# Patient Record
Sex: Male | Born: 1935 | Race: White | Hispanic: No | Smoking: Former smoker
Health system: Southern US, Community
[De-identification: ages and names within clinical notes are randomized; demographics above are authoritative.]

## PROBLEM LIST (undated history)

## (undated) DIAGNOSIS — I499 Cardiac arrhythmia, unspecified: Secondary | ICD-10-CM

## (undated) DIAGNOSIS — I219 Acute myocardial infarction, unspecified: Secondary | ICD-10-CM

---
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
## 2017-04-11 DIAGNOSIS — I219 Acute myocardial infarction, unspecified: Secondary | ICD-10-CM

## 2017-04-11 HISTORY — PX: CORONARY ANGIOPLASTY: SHX604

## 2017-04-11 HISTORY — DX: Acute myocardial infarction, unspecified: I21.9

## 2018-11-09 ENCOUNTER — Ambulatory Visit (INDEPENDENT_AMBULATORY_CARE_PROVIDER_SITE_OTHER): Payer: Medicare Other | Admitting: Urology

## 2018-11-09 DIAGNOSIS — N471 Phimosis: Secondary | ICD-10-CM

## 2018-12-27 ENCOUNTER — Other Ambulatory Visit: Payer: Self-pay | Admitting: Urology

## 2019-01-07 NOTE — Patient Instructions (Signed)
Christian Lloyd  01/07/2019     @PREFPERIOPPHARMACY @   Your procedure is scheduled on  01/13/2019  Report to [facility] at  [email or web address]M.  Call this number if you have problems the morning of surgery:  [phone]   Remember:  Do not eat or drink after midnight.                        Take these medicines the morning of surgery with A SIP OF WATER  Metoprolol.    Do not wear jewelry, make-up or nail polish.  Do not wear lotions, powders, or perfumes. Please wear deodorant and brush your teeth.  Do not shave 48 hours prior to surgery.  Men may shave face and neck.  Do not bring valuables to the hospital.  [facility] is not responsible for any belongings or valuables.  Contacts, dentures or bridgework may not be worn into surgery.  Leave your suitcase in the car.  After surgery it may be brought to your room.  For patients admitted to the hospital, discharge time will be determined by your treatment team.  Patients discharged the day of surgery will not be allowed to drive home.   Name and phone number of your driver:   family Special instructions:  FOLLOW ANY INSTRUCTIONS GIVEN TO YOU BY DR Diona Fanti CONCERNING HOLDING YOUR PLAVIX.  Please read over the following fact sheets that you were given. Anesthesia Post-op Instructions and Care and Recovery After Surgery       Circumcision, Adult, Care After This sheet gives you information about how to care for yourself after your procedure. Your doctor may also give you more specific instructions. If you have problems or questions, contact your doctor. Follow these instructions at home: Cut care   Follow instructions from your doctor about how to take care of your cut from surgery (incision). Make sure you: ? Wash your hands with soap and water before you change your bandage (dressing). If you cannot use soap and water, use hand sanitizer. ? Change your bandage as told by your doctor. ? Leave stitches (sutures), skin  glue, or skin tape (adhesive) strips in place. They may need to stay in place for 2 weeks or longer. If tape strips get loose and curl up, you may trim the loose edges. Do not remove tape strips completely unless your doctor says it is okay  Check your cut area every day for signs of infection. Check for: ? More redness, swelling, or pain. ? More fluid or blood. ? Warmth. ? Pus or a bad smell. Bathing  Do not take baths, swim, or use a hot tub until your doctor says it is okay. Ask your doctor if you can take showers. You may only be allowed to take sponge baths for bathing.  Do not get your cut area wet during the 24 hours after the procedure, or as long as told by your doctor.  When you can shower, do not rub the cut area. Gently pat it dry. General instructions  Avoid heavy lifting, contact sports, biking, or swimming until your doctor says it is okay.  Do not have sex until your doctor says it is okay.  Take or apply over-the-counter and prescription medicines only as told by your doctor.  Keep all follow-up visits as told by your doctor. This is important. Contact a doctor if:  Medicine does not help your pain.  You have  more redness, swelling, or pain around your cut.  You have more fluid or blood coming from your cut.  Your cut feels warm to the touch.  You have pus or a bad smell coming from your cut.  You have a fever. Get help right away if:  You cannot pee (urinate).  It hurts to pee.  Your pain is not helped by medicines.  There is redness, swelling, and soreness that spreads up the shaft of your penis, your thighs, or your lower belly (abdomen).  You have bleeding that does not stop when you press on it. Summary  Follow instructions from your doctor about how to take care of your cut from surgery (incision).  Check your cut area every day for signs of infection.  Do not have sex until your doctor says it is okay. This information is not intended to  replace advice given to you by your health care provider. Make sure you discuss any questions you have with your health care provider. Document Released: 10/15/2007 Document Revised: 04/10/2017 Document Reviewed: 05/06/2016 Elsevier Patient Education  2020 [employer]vier Inc.  General Anesthesia, Adult, Care After This sheet gives you information about how to care for yourself after your procedure. Your health care provider may also give you more specific instructions. If you have problems or questions, contact your health care provider. What can I expect after the procedure? After the procedure, the following side effects are common:  Pain or discomfort at the IV site.  Nausea.  Vomiting.  Sore throat.  Trouble concentrating.  Feeling cold or chills.  Weak or tired.  Sleepiness and fatigue.  Soreness and body aches. These side effects can affect parts of the body that were not involved in surgery. Follow these instructions at home:  For at least 24 hours after the procedure:  Have a responsible adult stay with you. It is important to have someone help care for you until you are awake and alert.  Rest as needed.  Do not: ? Participate in activities in which you could fall or become injured. ? Drive. ? Use heavy machinery. ? Drink alcohol. ? Take sleeping pills or medicines that cause drowsiness. ? Make important decisions or sign legal documents. ? Take care of children on your own. Eating and drinking  Follow any instructions from your health care provider about eating or drinking restrictions.  When you feel hungry, start by eating small amounts of foods that are soft and easy to digest (bland), such as toast. Gradually return to your regular diet.  Drink enough fluid to keep your urine pale yellow.  If you vomit, rehydrate by drinking water, juice, or clear broth. General instructions  If you have sleep apnea, surgery and certain medicines can increase your risk for  breathing problems. Follow instructions from your health care provider about wearing your sleep device: ? Anytime you are sleeping, including during daytime naps. ? While taking prescription pain medicines, sleeping medicines, or medicines that make you drowsy.  Return to your normal activities as told by your health care provider. Ask your health care provider what activities are safe for you.  Take over-the-counter and prescription medicines only as told by your health care provider.  If you smoke, do not smoke without supervision.  Keep all follow-up visits as told by your health care provider. This is important. Contact a health care provider if:  You have nausea or vomiting that does not get better with medicine.  You cannot eat or drink without  vomiting.  You have pain that does not get better with medicine.  You are unable to pass urine.  You develop a skin rash.  You have a fever.  You have redness around your IV site that gets worse. Get help right away if:  You have difficulty breathing.  You have chest pain.  You have blood in your urine or stool, or you vomit blood. Summary  After the procedure, it is common to have a sore throat or nausea. It is also common to feel tired.  Have a responsible adult stay with you for the first 24 hours after general anesthesia. It is important to have someone help care for you until you are awake and alert.  When you feel hungry, start by eating small amounts of foods that are soft and easy to digest (bland), such as toast. Gradually return to your regular diet.  Drink enough fluid to keep your urine pale yellow.  Return to your normal activities as told by your health care provider. Ask your health care provider what activities are safe for you. This information is not intended to replace advice given to you by your health care provider. Make sure you discuss any questions you have with your health care provider. Document  Released: 08/04/2000 Document Revised: 05/01/2017 Document Reviewed: 12/12/2016 Elsevier Patient Education  2020 [employer] How to Use Chlorhexidine for Bathing Chlorhexidine gluconate (CHG) is a germ-killing (antiseptic) solution that is used to clean the skin. It can get rid of the bacteria that normally live on the skin and can keep them away for about 24 hours. To clean your skin with CHG, you may be given:  A CHG solution to use in the shower or as part of a sponge bath.  A prepackaged cloth that contains CHG. Cleaning your skin with CHG may help lower the risk for infection:  While you are staying in the intensive care unit of the hospital.  If you have a vascular access, such as a central line, to provide short-term or long-term access to your veins.  If you have a catheter to drain urine from your bladder.  If you are on a ventilator. A ventilator is a machine that helps you breathe by moving air in and out of your lungs.  After surgery. What are the risks? Risks of using CHG include:  A skin reaction.  Hearing loss, if CHG gets in your ears.  Eye injury, if CHG gets in your eyes and is not rinsed out.  The CHG product catching fire. Make sure that you avoid smoking and flames after applying CHG to your skin. Do not use CHG:  If you have a chlorhexidine allergy or have previously reacted to chlorhexidine.  On babies younger than 232 months of age. How to use CHG solution  Use CHG only as told by your health care provider, and follow the instructions on the label.  Use the full amount of CHG as directed. Usually, this is one bottle. During a shower Follow these steps when using CHG solution during a shower (unless your health care provider gives you different instructions): 1. Start the shower. 2. Use your normal soap and shampoo to wash your face and hair. 3. Turn off the shower or move out of the shower stream. 4. Pour the CHG onto a clean washcloth. Do not use  any type of brush or rough-edged sponge. 5. Starting at your neck, lather your body down to your toes. Make sure you follow these  instructions: ? If you will be having surgery, pay special attention to the part of your body where you will be having surgery. Scrub this area for at least 1 minute. ? Do not use CHG on your head or face. If the solution gets into your ears or eyes, rinse them well with water. ? Avoid your genital area. ? Avoid any areas of skin that have broken skin, cuts, or scrapes. ? Scrub your back and under your arms. Make sure to wash skin folds. 6. Let the lather sit on your skin for 1-2 minutes or as long as told by your health care provider. 7. Thoroughly rinse your entire body in the shower. Make sure that all body creases and crevices are rinsed well. 8. Dry off with a clean towel. Do not put any substances on your body afterward--such as powder, lotion, or perfume--unless you are told to do so by your health care provider. Only use lotions that are recommended by the manufacturer. 9. Put on clean clothes or pajamas. 10. If it is the night before your surgery, sleep in clean sheets.  During a sponge bath Follow these steps when using CHG solution during a sponge bath (unless your health care provider gives you different instructions): 1. Use your normal soap and shampoo to wash your face and hair. 2. Pour the CHG onto a clean washcloth. 3. Starting at your neck, lather your body down to your toes. Make sure you follow these instructions: ? If you will be having surgery, pay special attention to the part of your body where you will be having surgery. Scrub this area for at least 1 minute. ? Do not use CHG on your head or face. If the solution gets into your ears or eyes, rinse them well with water. ? Avoid your genital area. ? Avoid any areas of skin that have broken skin, cuts, or scrapes. ? Scrub your back and under your arms. Make sure to wash skin folds. 4. Let the  lather sit on your skin for 1-2 minutes or as long as told by your health care provider. 5. Using a different clean, wet washcloth, thoroughly rinse your entire body. Make sure that all body creases and crevices are rinsed well. 6. Dry off with a clean towel. Do not put any substances on your body afterward--such as powder, lotion, or perfume--unless you are told to do so by your health care provider. Only use lotions that are recommended by the manufacturer. 7. Put on clean clothes or pajamas. 8. If it is the night before your surgery, sleep in clean sheets. How to use CHG prepackaged cloths  Only use CHG cloths as told by your health care provider, and follow the instructions on the label.  Use the CHG cloth on clean, dry skin.  Do not use the CHG cloth on your head or face unless your health care provider tells you to.  When washing with the CHG cloth: ? Avoid your genital area. ? Avoid any areas of skin that have broken skin, cuts, or scrapes. Before surgery Follow these steps when using a CHG cloth to clean before surgery (unless your health care provider gives you different instructions): 1. Using the CHG cloth, vigorously scrub the part of your body where you will be having surgery. Scrub using a back-and-forth motion for 3 minutes. The area on your body should be completely wet with CHG when you are done scrubbing. 2. Do not rinse. Discard the cloth and let the area  air-dry. Do not put any substances on the area afterward, such as powder, lotion, or perfume. 3. Put on clean clothes or pajamas. 4. If it is the night before your surgery, sleep in clean sheets.  For general bathing Follow these steps when using CHG cloths for general bathing (unless your health care provider gives you different instructions). 1. Use a separate CHG cloth for each area of your body. Make sure you wash between any folds of skin and between your fingers and toes. Wash your body in the following order,  switching to a new cloth after each step: ? The front of your neck, shoulders, and chest. ? Both of your arms, under your arms, and your hands. ? Your stomach and groin area, avoiding the genitals. ? Your right leg and foot. ? Your left leg and foot. ? The back of your neck, your back, and your buttocks. 2. Do not rinse. Discard the cloth and let the area air-dry. Do not put any substances on your body afterward--such as powder, lotion, or perfume--unless you are told to do so by your health care provider. Only use lotions that are recommended by the manufacturer. 3. Put on clean clothes or pajamas. Contact a health care provider if:  Your skin gets irritated after scrubbing.  You have questions about using your solution or cloth. Get help right away if:  Your eyes become very red or swollen.  Your eyes itch badly.  Your skin itches badly and is red or swollen.  Your hearing changes.  You have trouble seeing.  You have swelling or tingling in your mouth or throat.  You have trouble breathing.  You swallow any chlorhexidine. Summary  Chlorhexidine gluconate (CHG) is a germ-killing (antiseptic) solution that is used to clean the skin. Cleaning your skin with CHG may help to lower your risk for infection.  You may be given CHG to use for bathing. It may be in a bottle or in a prepackaged cloth to use on your skin. Carefully follow your health care provider's instructions and the instructions on the product label.  Do not use CHG if you have a chlorhexidine allergy.  Contact your health care provider if your skin gets irritated after scrubbing. This information is not intended to replace advice given to you by your health care provider. Make sure you discuss any questions you have with your health care provider. Document Released: 01/21/2012 Document Revised: 07/15/2018 Document Reviewed: 03/26/2017 Elsevier Patient Education  2020 [employer]

## 2019-01-11 ENCOUNTER — Other Ambulatory Visit: Payer: Self-pay

## 2019-01-11 ENCOUNTER — Encounter (HOSPITAL_COMMUNITY)
Admission: RE | Admit: 2019-01-11 | Discharge: 2019-01-11 | Disposition: A | Discharge status: Home / Self Care | Payer: Medicare Other | Source: Ambulatory Visit | Attending: Urology | Admitting: Urology

## 2019-01-11 ENCOUNTER — Encounter (HOSPITAL_COMMUNITY): Payer: Self-pay

## 2019-01-11 ENCOUNTER — Other Ambulatory Visit (HOSPITAL_COMMUNITY)
Admission: RE | Admit: 2019-01-11 | Discharge: 2019-01-11 | Disposition: A | Discharge status: Home / Self Care | Payer: Medicare Other | Source: Ambulatory Visit | Attending: Urology | Admitting: Urology

## 2019-01-11 DIAGNOSIS — N480 Leukoplakia of penis: Secondary | ICD-10-CM | POA: Diagnosis not present

## 2019-01-11 DIAGNOSIS — I252 Old myocardial infarction: Secondary | ICD-10-CM | POA: Diagnosis not present

## 2019-01-11 DIAGNOSIS — Z87891 Personal history of nicotine dependence: Secondary | ICD-10-CM | POA: Diagnosis not present

## 2019-01-11 DIAGNOSIS — Z955 Presence of coronary angioplasty implant and graft: Secondary | ICD-10-CM | POA: Diagnosis not present

## 2019-01-11 DIAGNOSIS — N471 Phimosis: Secondary | ICD-10-CM | POA: Diagnosis not present

## 2019-01-11 HISTORY — DX: Cardiac arrhythmia, unspecified: I49.9

## 2019-01-11 LAB — BASIC METABOLIC PANEL
Anion gap: 10 (ref 5–15)
BUN: 10 mg/dL (ref 8–23)
CO2: 23 mmol/L (ref 22–32)
Calcium: 8.7 mg/dL — ABNORMAL LOW (ref 8.9–10.3)
Chloride: 106 mmol/L (ref 98–111)
Creatinine, Ser: 0.82 mg/dL (ref 0.61–1.24)
GFR calc Af Amer: >60 mL/min (ref >60)
GFR calc non Af Amer: >60 mL/min (ref >60)
Glucose, Bld: 188 mg/dL — ABNORMAL HIGH (ref 70–99)
Potassium: 4 mmol/L (ref 3.5–5.1)
Sodium: 139 mmol/L (ref 135–145)

## 2019-01-11 LAB — SARS CORONAVIRUS 2 (TAT 6-24 HRS): SARS Coronavirus 2: NEGATIVE

## 2019-01-11 NOTE — H&P (Signed)
H&P  Chief Complaint: Difficulty with foreskin  History of Present Illness:  85 year old male presents for elective circumcision.  He has had worsening problems with his foreskin and has significant phimosis.  The  Past Medical History:  Diagnosis Date  . Dysrhythmia   . Myocardial infarction [facility]) 04/2017    Past Surgical History:  Procedure Laterality Date  . CORONARY ANGIOPLASTY  04/2017   [facility]    Home Medications:  Allergies as of 01/11/2019   No Known Allergies     Medication List    Notice   Cannot display discharge medications because the patient has not yet been admitted.     Allergies: No Known Allergies  No family history on file.  Social History:  reports that he quit smoking about 22 years ago. His smoking use included cigarettes. He has never used smokeless tobacco. He reports that he does not drink alcohol or use drugs.  ROS: A complete review of systems was performed.  All systems are negative except for pertinent findings as noted.  Physical Exam:  Vital signs in last 24 hours: Temp:  [97.5 F (36.4 C)] 97.5 F (36.4 C) (09/01 0928) Pulse Rate:  [58] 58 (09/01 0928) Resp:  [18] 18 (09/01 0928) BP: (141)/(60) 141/60 (09/01 0928) SpO2:  [97 %] 97 % (09/01 0928) Weight:  [73.5 kg] 73.5 kg (09/01 0928) Constitutional:  Alert and oriented, No acute distress Cardiovascular: Regular rate  Respiratory: Normal respiratory effort GI: Abdomen is soft, nontender, nondistended, no abdominal masses. No CVAT.  Genitourinary: Normal male phallus, testes are descended bilaterally and non-tender and without masses, scrotum is normal in appearance without lesions or masses, perineum is normal on inspection. Lymphatic: No lymphadenopathy Neurologic: Grossly intact, no focal deficits Psychiatric: Normal mood and affect  Laboratory Data:  No results for input(s): WBC, HGB, HCT, PLT in the last 72 hours.  No results for input(s): NA, K, CL, GLUCOSE, BUN,  CALCIUM, CREATININE in the last 72 hours.  Invalid input(s): CO3   No results found for this or any previous visit (from the past 24 hour(s)). No results found for this or any previous visit (from the past 240 hour(s)).  Renal Function: No results for input(s): CREATININE in the last 168 hours. CrCl cannot be calculated (No successful lab value found.).  Radiologic Imaging: No results found.  Impression/Assessment:   phimosis  Plan:   circumcision

## 2019-01-13 ENCOUNTER — Ambulatory Visit (HOSPITAL_COMMUNITY): Payer: Medicare Other | Admitting: Anesthesiology

## 2019-01-13 ENCOUNTER — Encounter (HOSPITAL_COMMUNITY)
Admission: RE | Disposition: A | Discharge status: Home / Self Care | Payer: Self-pay | Source: Home / Self Care | Attending: Urology

## 2019-01-13 ENCOUNTER — Ambulatory Visit (HOSPITAL_COMMUNITY)
Admission: RE | Admit: 2019-01-13 | Discharge: 2019-01-13 | Disposition: A | Discharge status: Home / Self Care | Payer: Medicare Other | Attending: Urology | Admitting: Urology

## 2019-01-13 ENCOUNTER — Encounter (HOSPITAL_COMMUNITY): Payer: Self-pay | Admitting: *Deleted

## 2019-01-13 DIAGNOSIS — Z87891 Personal history of nicotine dependence: Secondary | ICD-10-CM | POA: Diagnosis not present

## 2019-01-13 DIAGNOSIS — Z955 Presence of coronary angioplasty implant and graft: Secondary | ICD-10-CM | POA: Insufficient documentation

## 2019-01-13 DIAGNOSIS — N471 Phimosis: Secondary | ICD-10-CM

## 2019-01-13 DIAGNOSIS — N480 Leukoplakia of penis: Secondary | ICD-10-CM | POA: Insufficient documentation

## 2019-01-13 DIAGNOSIS — I252 Old myocardial infarction: Secondary | ICD-10-CM | POA: Diagnosis not present

## 2019-01-13 HISTORY — PX: CIRCUMCISION: SHX1350

## 2019-01-13 SURGERY — CIRCUMCISION, ADULT
Anesthesia: General | Site: Penis

## 2019-01-13 MED ORDER — MIDAZOLAM HCL 2 MG/2ML IJ SOLN: 0.5000 mg | Freq: Once | INTRAMUSCULAR | Status: DC | PRN

## 2019-01-13 MED ORDER — PHENYLEPHRINE 40 MCG/ML (10ML) SYRINGE FOR IV PUSH (FOR BLOOD PRESSURE SUPPORT)
PREFILLED_SYRINGE | INTRAVENOUS | Status: AC
  Filled 2019-01-13: qty 10

## 2019-01-13 MED ORDER — CEFAZOLIN SODIUM-DEXTROSE 2-4 GM/100ML-% IV SOLN
INTRAVENOUS | Status: AC
  Filled 2019-01-13: qty 100

## 2019-01-13 MED ORDER — HYDROCODONE-ACETAMINOPHEN 7.5-325 MG PO TABS: 1.0000 | ORAL_TABLET | Freq: Once | ORAL | Status: DC | PRN

## 2019-01-13 MED ORDER — PROMETHAZINE HCL 25 MG/ML IJ SOLN: 6.2500 mg | INTRAMUSCULAR | Status: DC | PRN

## 2019-01-13 MED ORDER — GLYCOPYRROLATE PF 0.2 MG/ML IJ SOSY
PREFILLED_SYRINGE | INTRAMUSCULAR | Status: AC
  Filled 2019-01-13: qty 2

## 2019-01-13 MED ORDER — LIDOCAINE HCL 1 % IJ SOLN
INTRAMUSCULAR | Status: DC | PRN
  Administered 2019-01-13: 10 mL via INTRAMUSCULAR

## 2019-01-13 MED ORDER — HYDROMORPHONE HCL 1 MG/ML IJ SOLN: 0.2500 mg | INTRAMUSCULAR | Status: DC | PRN

## 2019-01-13 MED ORDER — PROPOFOL 10 MG/ML IV BOLUS
INTRAVENOUS | Status: DC | PRN
  Administered 2019-01-13: 120 mg via INTRAVENOUS

## 2019-01-13 MED ORDER — CEFAZOLIN SODIUM-DEXTROSE 2-4 GM/100ML-% IV SOLN
2.0000 g | INTRAVENOUS | Status: AC
  Administered 2019-01-13: 2 g via INTRAVENOUS

## 2019-01-13 MED ORDER — LIDOCAINE HCL (PF) 1 % IJ SOLN
INTRAMUSCULAR | Status: AC
  Filled 2019-01-13: qty 30

## 2019-01-13 MED ORDER — EPHEDRINE SULFATE 50 MG/ML IJ SOLN
INTRAMUSCULAR | Status: DC | PRN
  Administered 2019-01-13 (×2): 10 mg via INTRAVENOUS

## 2019-01-13 MED ORDER — LIDOCAINE 2% (20 MG/ML) 5 ML SYRINGE
INTRAMUSCULAR | Status: AC
  Filled 2019-01-13: qty 10

## 2019-01-13 MED ORDER — SEVOFLURANE IN SOLN
RESPIRATORY_TRACT | Status: AC
  Filled 2019-01-13: qty 250

## 2019-01-13 MED ORDER — FENTANYL CITRATE (PF) 100 MCG/2ML IJ SOLN
INTRAMUSCULAR | Status: DC | PRN
  Administered 2019-01-13: 50 ug via INTRAVENOUS

## 2019-01-13 MED ORDER — 0.9 % SODIUM CHLORIDE (POUR BTL) OPTIME
TOPICAL | Status: DC | PRN
  Administered 2019-01-13: 1000 mL

## 2019-01-13 MED ORDER — LIDOCAINE 2% (20 MG/ML) 5 ML SYRINGE
INTRAMUSCULAR | Status: DC | PRN
  Administered 2019-01-13: 40 mg via INTRAVENOUS

## 2019-01-13 MED ORDER — LACTATED RINGERS IV SOLN: INTRAVENOUS | Status: DC

## 2019-01-13 MED ORDER — ROCURONIUM BROMIDE 10 MG/ML (PF) SYRINGE
PREFILLED_SYRINGE | INTRAVENOUS | Status: AC
  Filled 2019-01-13: qty 20

## 2019-01-13 MED ORDER — LACTATED RINGERS IV SOLN
INTRAVENOUS | Status: DC | PRN
  Administered 2019-01-13: 08:00:00 via INTRAVENOUS

## 2019-01-13 MED ORDER — PROPOFOL 10 MG/ML IV BOLUS
INTRAVENOUS | Status: AC
  Filled 2019-01-13: qty 40

## 2019-01-13 MED ORDER — GLYCOPYRROLATE PF 0.2 MG/ML IJ SOSY
PREFILLED_SYRINGE | INTRAMUSCULAR | Status: DC | PRN
  Administered 2019-01-13: .2 mg via INTRAVENOUS

## 2019-01-13 MED ORDER — BUPIVACAINE HCL (PF) 0.25 % IJ SOLN
INTRAMUSCULAR | Status: AC
  Filled 2019-01-13: qty 30

## 2019-01-13 MED ORDER — FENTANYL CITRATE (PF) 100 MCG/2ML IJ SOLN
INTRAMUSCULAR | Status: AC
  Filled 2019-01-13: qty 2

## 2019-01-13 SURGICAL SUPPLY — 25 items
BNDG COHESIVE 1X5 TAN STRL LF (GAUZE/BANDAGES/DRESSINGS) ×3 IMPLANT
BNDG CONFORM 2 STRL LF (GAUZE/BANDAGES/DRESSINGS) ×3 IMPLANT
CLOTH BEACON ORANGE TIMEOUT ST (SAFETY) ×3 IMPLANT
COVER LIGHT HANDLE STERIS (MISCELLANEOUS) ×6 IMPLANT
DECANTER SPIKE VIAL GLASS SM (MISCELLANEOUS) ×3 IMPLANT
DRSG XEROFORM 1X8 (GAUZE/BANDAGES/DRESSINGS) ×3 IMPLANT
ELECT NEEDLE TIP 2.8 STRL (NEEDLE) IMPLANT
ELECT REM PT RETURN 9FT ADLT (ELECTROSURGICAL) ×3
ELECTRODE REM PT RTRN 9FT ADLT (ELECTROSURGICAL) ×1 IMPLANT
GAUZE PETROLATUM 1 X8 (GAUZE/BANDAGES/DRESSINGS) ×3 IMPLANT
GLOVE BIOGEL M 8.0 STRL (GLOVE) ×3 IMPLANT
GLOVE BIOGEL PI IND STRL 7.0 (GLOVE) ×3 IMPLANT
GLOVE BIOGEL PI INDICATOR 7.0 (GLOVE) ×6
GLOVE ECLIPSE 6.5 STRL STRAW (GLOVE) ×3 IMPLANT
GOWN STRL REUS W/TWL LRG LVL3 (GOWN DISPOSABLE) ×3 IMPLANT
GOWN STRL REUS W/TWL XL LVL3 (GOWN DISPOSABLE) ×3 IMPLANT
KIT TURNOVER KIT A (KITS) ×3 IMPLANT
NEEDLE HYPO 22GX1.5 SAFETY (NEEDLE) ×3 IMPLANT
NS IRRIG 1000ML POUR BTL (IV SOLUTION) ×3 IMPLANT
PACK MINOR (CUSTOM PROCEDURE TRAY) ×3 IMPLANT
SET BASIN LINEN APH (SET/KITS/TRAYS/PACK) ×3 IMPLANT
SOL PREP PROV IODINE SCRUB 4OZ (MISCELLANEOUS) ×3 IMPLANT
SUT CHROMIC 4 0 RB 1X27 (SUTURE) ×6 IMPLANT
SUT CHROMIC 5 0 RB 1 27 (SUTURE) IMPLANT
TOWEL OR 17X26 4PK STRL BLUE (TOWEL DISPOSABLE) ×3 IMPLANT

## 2019-01-13 NOTE — Anesthesia Procedure Notes (Signed)
Procedure Name: LMA Insertion Date/Time: 01/13/2019 8:12 AM Performed by: Adams, Amy A, CRNA Pre-anesthesia Checklist: Patient identified, Emergency Drugs available, Suction available, Patient being monitored and Timeout performed Patient Re-evaluated:Patient Re-evaluated prior to induction Oxygen Delivery Method: Circle system utilized Preoxygenation: Pre-oxygenation with 100% oxygen Induction Type: IV induction LMA: LMA inserted LMA Size: 4.0 Number of attempts: 1 Placement Confirmation: positive ETCO2 and breath sounds checked- equal and bilateral Tube secured with: Tape Dental Injury: Teeth and Oropharynx as per pre-operative assessment

## 2019-01-13 NOTE — Transfer of Care (Signed)
Immediate Anesthesia Transfer of Care Note  Patient: Christian Lloyd  Procedure(s) Performed: CIRCUMCISION ADULT (N/A Penis)  Patient Location: PACU  Anesthesia Type:General  Level of Consciousness: oriented, drowsy and patient cooperative  Airway & Oxygen Therapy: Patient Spontanous Breathing and Patient connected to face mask oxygen  Post-op Assessment: Report given to RN and Post -op Vital signs reviewed and stable  Post vital signs: Reviewed and stable  Last Vitals:  Vitals Value Taken Time  BP 121/64 01/13/19 0900  Temp    Pulse 65 01/13/19 0901  Resp 15 01/13/19 0901  SpO2 100 % 01/13/19 0901  Vitals shown include unvalidated device data.  Last Pain:  Vitals:   01/13/19 0705  TempSrc: Oral  PainSc: 0-No pain      Patients Stated Pain Goal: 5 (35/59/74 1638)  Complications: No apparent anesthesia complications

## 2019-01-13 NOTE — Interval H&P Note (Signed)
History and Physical Interval Note:  01/13/2019 7:58 AM  Christian Lloyd  has presented today for surgery, with the diagnosis of PHIMOSIS.  The various methods of treatment have been discussed with the patient and family. After consideration of risks, benefits and other options for treatment, the patient has consented to  Procedure(s) with comments: CIRCUMCISION ADULT (N/A) - 30 MINS as a surgical intervention.  The patient's history has been reviewed, patient examined, no change in status, stable for surgery.  I have reviewed the patient's chart and labs.  Questions were answered to the patient's satisfaction.     Lillette Boxer Kasara Schomer

## 2019-01-13 NOTE — Op Note (Signed)
Preoperative diagnosis: Phimosis Postoperative diagnosis: Same  Procedure: Circumcision Surgeon: Lillette Boxer. Chivonne Rascon, MD. Anesthesia: Gen. with penile block Specimen: foreskin Complications: none [id number]  Indications: The patient was recently evaluated for phimosis. All risks and benefits of circumcision discussed. Full informed consent obtained. The patient now presents for definitive procedure.  Technique and findings: The patient was brought to the operating room. Successful induction of general anesthesia.  The patient was then prepped and draped in usual manner. Appropriate surgical timeout was performed. A penile block was then performed with 10cc of 0.25 % plain Marcaine/1 % plain lidocaine. Proximal and distal incision sites were marked with a pen, and appropriate circumferential incisions were created. The sleeve of redundant skin was removed with the bovie. Hemostatis was achieved using electrocautery.Quadrant sutures were placed with interrupted 4-0 chromic, with the phrenular suture being a "U" stitch. In between, the same 4-0 chromic was used to re approximate the skin edges with a running simple stitch.  The incision was wrapped with Xeroform gauze, a plain guaze wrap and Coban dressing. The patient was brought to recovery room in stable condition having had no obvious complications or problems. Sponge and needle counts were correct.

## 2019-01-13 NOTE — Anesthesia Postprocedure Evaluation (Signed)
Anesthesia Post Note Late Entry for 0915 Patient: Christian Lloyd  Procedure(s) Performed: CIRCUMCISION ADULT (N/A Penis)  Patient location during evaluation: PACU Anesthesia Type: General Level of consciousness: awake and alert, oriented and patient cooperative Pain management: pain level controlled Vital Signs Assessment: post-procedure vital signs reviewed and stable Respiratory status: spontaneous breathing Cardiovascular status: stable Postop Assessment: no apparent nausea or vomiting Anesthetic complications: no     Last Vitals:  Vitals:   01/13/19 0930 01/13/19 0940  BP: (!) 122/56 135/62  Pulse: 65 62  Resp: 18 16  Temp:  36.5 C  SpO2:  95%    Last Pain:  Vitals:   01/13/19 0940  TempSrc: Oral  PainSc: 0-No pain                 ADAMS, AMY A

## 2019-01-13 NOTE — Progress Notes (Signed)
Releaved Richarda Osmond, RN

## 2019-01-13 NOTE — Anesthesia Preprocedure Evaluation (Signed)
Anesthesia Evaluation  Patient identified by MRN, date of birth, ID band Patient awake    Reviewed: Allergy & Precautions, NPO status , Patient's Chart, lab work & pertinent test results, reviewed documented beta blocker date and time   Airway Mallampati: II  TM Distance: >3 FB Neck ROM: Full    Dental no notable dental hx. (+) Teeth Intact   Pulmonary neg pulmonary ROS, former smoker,    Pulmonary exam normal breath sounds clear to auscultation       Cardiovascular Exercise Tolerance: Good + Past MI, + Cardiac Stents and + CABG  Normal cardiovascular exam(-) dysrhythmias I Rhythm:Regular Rate:Normal  States can walk 1/4 mile  Denies recent CP   Neuro/Psych negative neurological ROS  negative psych ROS   GI/Hepatic negative GI ROS, Neg liver ROS,   Endo/Other  negative endocrine ROS  Renal/GU negative Renal ROS  negative genitourinary   Musculoskeletal negative musculoskeletal ROS (+)   Abdominal   Peds negative pediatric ROS (+)  Hematology negative hematology ROS (+)   Anesthesia Other Findings   Reproductive/Obstetrics negative OB ROS                             Anesthesia Physical Anesthesia Plan  ASA: III  Anesthesia Plan: General   Post-op Pain Management:    Induction: Intravenous  PONV Risk Score and Plan: Dexamethasone, Ondansetron and Treatment may vary due to age or medical condition  Airway Management Planned: LMA  Additional Equipment:   Intra-op Plan:   Post-operative Plan: Extubation in OR  Informed Consent: I have reviewed the patients History and Physical, chart, labs and discussed the procedure including the risks, benefits and alternatives for the proposed anesthesia with the patient or authorized representative who has indicated his/her understanding and acceptance.     Dental advisory given  Plan Discussed with: CRNA  Anesthesia Plan Comments:  (Plan Full PPE use  Plan GA (LMA) as tolerated vs GETA d/w Pt -WTP with same after Q&A)        Anesthesia Quick Evaluation

## 2019-01-13 NOTE — Discharge Instructions (Signed)
Postoperative instructions for circumcision  Wound:  Remove the dressing the morning after surgery. In most cases your incision will have absorbable sutures that run along the course of your incision and will dissolve within the first 10-20 days. Some will fall out even earlier. Expect some redness as the sutures dissolved but this should occur only around the sutures. If there is generalized redness, especially with increasing pain or swelling, let us know. The penis will possibly get "black and blue" as the blood in the tissues spread. Sometimes the whole penis will turn colors. The black and blue is followed by a yellow and brown color. In time, all the discoloration will go away.  Diet:  You may return to your normal diet within 24 hours following your surgery. You may note some mild nausea and possibly vomiting the first 6-8 hours following surgery. This is usually due to the side effects of anesthesia, and will disappear quite soon. I would suggest clear liquids and a very light meal the first evening following your surgery.  Activity:  Your physical activity should be restricted the first 48 hours. During that time you should remain relatively inactive, moving about only when necessary. During the first 7-10 days following surgery he should avoid lifting any heavy objects (anything greater than 15 pounds), and avoid strenuous exercise. If you work, ask [place] specifically about your restrictions, both for work and home. We will write a note to your employer if needed.  Ice packs can be placed on and off over the penis for the first 48 hours to help relieve the pain and keep the swelling down. Frozen peas or corn in a ZipLock bag can be frozen, used and re-frozen. Fifteen minutes on and 15 minutes off is a reasonable schedule.  No sexual activity for 1 month.  Pull the skin at the base of the penis back on a regular basis to keep the skin from scarring over the head of the penis again  OK to  restart blood thinner on Saturday 9.5  Hygiene:  You may shower 24 hours after your surgery. Make sure wound is clean and dry afterwards. Tub bathing should be restricted until the seventh day.  Medication:  It is OK to take ibuprofen or tylenol for pain.  Problems you should report to [place]:   Fever of 101.0 degrees Fahrenheit or greater.  Moderate or severe swelling under the skin incision or involving the scrotum.  Drug reaction such as hives, a rash, nausea or vomiting.   Difficulty voiding

## 2019-01-14 ENCOUNTER — Encounter (HOSPITAL_COMMUNITY): Payer: Self-pay | Admitting: Urology

## 2019-03-01 ENCOUNTER — Ambulatory Visit (INDEPENDENT_AMBULATORY_CARE_PROVIDER_SITE_OTHER): Payer: Medicare Other | Admitting: Urology

## 2019-03-01 DIAGNOSIS — N471 Phimosis: Secondary | ICD-10-CM

## 2019-03-01 DIAGNOSIS — N480 Leukoplakia of penis: Secondary | ICD-10-CM

## 2019-05-03 ENCOUNTER — Encounter: Payer: Self-pay | Admitting: Urology

## 2019-07-16 ENCOUNTER — Ambulatory Visit: Payer: Medicare Other | Attending: Internal Medicine

## 2019-07-16 ENCOUNTER — Other Ambulatory Visit: Payer: Self-pay

## 2019-07-16 DIAGNOSIS — Z23 Encounter for immunization: Secondary | ICD-10-CM

## 2019-07-16 NOTE — Progress Notes (Signed)
   Covid-19 Vaccination Clinic  Name:  Berry Godsey    MRN: [id number] DOB: 1933-08-10  07/16/2019  Mr. Blanke was observed post Covid-19 immunization for 15 minutes without incident. He was provided with Vaccine Information Sheet and instruction to access the V-Safe system.   Mr. Bacallao was instructed to call 911 with any severe reactions post vaccine: Marland Kitchen Difficulty breathing  . Swelling of face and throat  . A fast heartbeat  . A bad rash all over body  . Dizziness and weakness   Immunizations Administered    Name Date Dose VIS Date Route   Moderna COVID-19 Vaccine 07/16/2019  9:09 AM 0.5 mL 04/12/2019 Intramuscular   Manufacturer: Moderna   Lot: [id number]   NDC: 52174-715-95

## 2019-08-17 ENCOUNTER — Ambulatory Visit: Payer: Medicare Other | Attending: Internal Medicine

## 2019-08-17 DIAGNOSIS — Z23 Encounter for immunization: Secondary | ICD-10-CM

## 2019-08-17 NOTE — Progress Notes (Signed)
   Covid-19 Vaccination Clinic  Name:  Christian Lloyd    MRN: [id number] DOB: 1933/06/27  08/17/2019  Mr. [name] was observed post Covid-19 immunization for 15 minutes without incident. He was provided with Vaccine Information Sheet and instruction to access the V-Safe system.   Mr. [name] was instructed to call 911 with any severe reactions post vaccine: Marland Kitchen Difficulty breathing  . Swelling of face and throat  . A fast heartbeat  . A bad rash all over body  . Dizziness and weakness   Immunizations Administered    Name Date Dose VIS Date Route   Moderna COVID-19 Vaccine 08/17/2019  9:12 AM 0.5 mL 04/12/2019 Intramuscular   Manufacturer: Gala Murdoch   Lot: [id number]   NDC: 94174-081-44

## 2019-12-17 ENCOUNTER — Encounter (HOSPITAL_COMMUNITY): Payer: Self-pay | Admitting: Internal Medicine

## 2019-12-17 ENCOUNTER — Observation Stay (HOSPITAL_COMMUNITY): Payer: Medicare Other

## 2019-12-17 ENCOUNTER — Emergency Department (HOSPITAL_COMMUNITY): Payer: Medicare Other

## 2019-12-17 ENCOUNTER — Other Ambulatory Visit: Payer: Self-pay

## 2019-12-17 ENCOUNTER — Observation Stay (HOSPITAL_COMMUNITY)
Admission: EM | Admit: 2019-12-17 | Discharge: 2019-12-18 | Disposition: A | Discharge status: Home / Self Care | Payer: Medicare Other | Attending: Internal Medicine | Admitting: Internal Medicine

## 2019-12-17 DIAGNOSIS — Z7984 Long term (current) use of oral hypoglycemic drugs: Secondary | ICD-10-CM | POA: Insufficient documentation

## 2019-12-17 DIAGNOSIS — Z87891 Personal history of nicotine dependence: Secondary | ICD-10-CM | POA: Insufficient documentation

## 2019-12-17 DIAGNOSIS — G459 Transient cerebral ischemic attack, unspecified: Principal | ICD-10-CM | POA: Diagnosis present

## 2019-12-17 DIAGNOSIS — I480 Paroxysmal atrial fibrillation: Secondary | ICD-10-CM | POA: Diagnosis not present

## 2019-12-17 DIAGNOSIS — E119 Type 2 diabetes mellitus without complications: Secondary | ICD-10-CM | POA: Diagnosis not present

## 2019-12-17 DIAGNOSIS — I119 Hypertensive heart disease without heart failure: Secondary | ICD-10-CM | POA: Insufficient documentation

## 2019-12-17 DIAGNOSIS — E785 Hyperlipidemia, unspecified: Secondary | ICD-10-CM | POA: Diagnosis not present

## 2019-12-17 DIAGNOSIS — Z7982 Long term (current) use of aspirin: Secondary | ICD-10-CM | POA: Insufficient documentation

## 2019-12-17 DIAGNOSIS — I2583 Coronary atherosclerosis due to lipid rich plaque: Secondary | ICD-10-CM

## 2019-12-17 DIAGNOSIS — I639 Cerebral infarction, unspecified: Secondary | ICD-10-CM

## 2019-12-17 DIAGNOSIS — I2510 Atherosclerotic heart disease of native coronary artery without angina pectoris: Secondary | ICD-10-CM | POA: Diagnosis present

## 2019-12-17 DIAGNOSIS — G40109 Localization-related (focal) (partial) symptomatic epilepsy and epileptic syndromes with simple partial seizures, not intractable, without status epilepticus: Secondary | ICD-10-CM | POA: Diagnosis not present

## 2019-12-17 DIAGNOSIS — I10 Essential (primary) hypertension: Secondary | ICD-10-CM | POA: Diagnosis present

## 2019-12-17 DIAGNOSIS — Z79899 Other long term (current) drug therapy: Secondary | ICD-10-CM | POA: Insufficient documentation

## 2019-12-17 DIAGNOSIS — Z20822 Contact with and (suspected) exposure to covid-19: Secondary | ICD-10-CM | POA: Insufficient documentation

## 2019-12-17 LAB — CBC
HCT: 46.5 % (ref 39.0–52.0)
Hemoglobin: 15.1 g/dL (ref 13.0–17.0)
MCH: 29.2 pg (ref 26.0–34.0)
MCHC: 32.5 g/dL (ref 30.0–36.0)
MCV: 89.9 fL (ref 80.0–100.0)
Platelets: 212 10*3/uL (ref 150–400)
RBC: 5.17 MIL/uL (ref 4.22–5.81)
RDW: 13.6 % (ref 11.5–15.5)
WBC: 8.5 10*3/uL (ref 4.0–10.5)
nRBC: 0 % (ref 0.0–0.2)

## 2019-12-17 LAB — DIFFERENTIAL
Abs Immature Granulocytes: 0.02 10*3/uL (ref 0.00–0.07)
Basophils Absolute: 0.1 10*3/uL (ref 0.0–0.1)
Basophils Relative: 1 %
Eosinophils Absolute: 0.2 10*3/uL (ref 0.0–0.5)
Eosinophils Relative: 3 %
Immature Granulocytes: 0 %
Lymphocytes Relative: 42 %
Lymphs Abs: 3.6 10*3/uL (ref 0.7–4.0)
Monocytes Absolute: 0.8 10*3/uL (ref 0.1–1.0)
Monocytes Relative: 10 %
Neutro Abs: 3.8 10*3/uL (ref 1.7–7.7)
Neutrophils Relative %: 44 %

## 2019-12-17 LAB — I-STAT CHEM 8, ED
BUN: 18 mg/dL (ref 8–23)
Calcium, Ion: 1.05 mmol/L — ABNORMAL LOW (ref 1.15–1.40)
Chloride: 101 mmol/L (ref 98–111)
Creatinine, Ser: 1 mg/dL (ref 0.61–1.24)
Glucose, Bld: 195 mg/dL — ABNORMAL HIGH (ref 70–99)
HCT: 44 % (ref 39.0–52.0)
Hemoglobin: 15 g/dL (ref 13.0–17.0)
Potassium: 3.8 mmol/L (ref 3.5–5.1)
Sodium: 141 mmol/L (ref 135–145)
TCO2: 26 mmol/L (ref 22–32)

## 2019-12-17 LAB — COMPREHENSIVE METABOLIC PANEL
ALT: 15 U/L (ref 0–44)
AST: 17 U/L (ref 15–41)
Albumin: 3.9 g/dL (ref 3.5–5.0)
Alkaline Phosphatase: 47 U/L (ref 38–126)
Anion gap: 12 (ref 5–15)
BUN: 16 mg/dL (ref 8–23)
CO2: 23 mmol/L (ref 22–32)
Calcium: 9.1 mg/dL (ref 8.9–10.3)
Chloride: 103 mmol/L (ref 98–111)
Creatinine, Ser: 1.06 mg/dL (ref 0.61–1.24)
GFR calc Af Amer: >60 mL/min (ref >60)
GFR calc non Af Amer: >60 mL/min (ref >60)
Glucose, Bld: 203 mg/dL — ABNORMAL HIGH (ref 70–99)
Potassium: 3.8 mmol/L (ref 3.5–5.1)
Sodium: 138 mmol/L (ref 135–145)
Total Bilirubin: 0.8 mg/dL (ref 0.3–1.2)
Total Protein: 6.6 g/dL (ref 6.5–8.1)

## 2019-12-17 LAB — URINALYSIS, ROUTINE W REFLEX MICROSCOPIC
Bilirubin Urine: NEGATIVE
Glucose, UA: NEGATIVE mg/dL
Hgb urine dipstick: NEGATIVE
Ketones, ur: NEGATIVE mg/dL
Leukocytes,Ua: NEGATIVE
Nitrite: NEGATIVE
Protein, ur: NEGATIVE mg/dL
Specific Gravity, Urine: >1.046 — ABNORMAL HIGH (ref 1.005–1.030)
pH: 5 (ref 5.0–8.0)

## 2019-12-17 LAB — TROPONIN I (HIGH SENSITIVITY): Troponin I (High Sensitivity): 11 ng/L (ref <18)

## 2019-12-17 LAB — CBG MONITORING, ED: Glucose-Capillary: 120 mg/dL — ABNORMAL HIGH (ref 70–99)

## 2019-12-17 LAB — PROTIME-INR
INR: 1 (ref 0.8–1.2)
Prothrombin Time: 13.1 seconds (ref 11.4–15.2)

## 2019-12-17 LAB — HEMOGLOBIN A1C
Hgb A1c MFr Bld: 7.2 % — ABNORMAL HIGH (ref 4.8–5.6)
Mean Plasma Glucose: 159.94 mg/dL

## 2019-12-17 LAB — APTT: aPTT: 32 seconds (ref 24–36)

## 2019-12-17 MED ORDER — ACETAMINOPHEN 650 MG RE SUPP: 650.0000 mg | RECTAL | Status: DC | PRN

## 2019-12-17 MED ORDER — ASPIRIN EC 81 MG PO TBEC
81.0000 mg | DELAYED_RELEASE_TABLET | Freq: Every day | ORAL | Status: DC
  Administered 2019-12-18: 81 mg via ORAL
  Filled 2019-12-17: qty 1

## 2019-12-17 MED ORDER — SODIUM CHLORIDE 0.9 % IV SOLN: INTRAVENOUS | Status: DC

## 2019-12-17 MED ORDER — ACETAMINOPHEN 325 MG PO TABS
650.0000 mg | ORAL_TABLET | ORAL | Status: DC | PRN
  Administered 2019-12-18: 650 mg via ORAL
  Filled 2019-12-17: qty 2

## 2019-12-17 MED ORDER — IOHEXOL 350 MG/ML SOLN
75.0000 mL | Freq: Once | INTRAVENOUS | Status: AC | PRN
  Administered 2019-12-17: 75 mL via INTRAVENOUS

## 2019-12-17 MED ORDER — INSULIN ASPART 100 UNIT/ML ~~LOC~~ SOLN: 0.0000 [IU] | SUBCUTANEOUS | Status: DC

## 2019-12-17 MED ORDER — ROSUVASTATIN CALCIUM 20 MG PO TABS: 20.0000 mg | ORAL_TABLET | Freq: Every day | ORAL | Status: DC

## 2019-12-17 MED ORDER — SODIUM CHLORIDE 0.9% FLUSH
3.0000 mL | Freq: Once | INTRAVENOUS | Status: AC
  Administered 2019-12-18: 3 mL via INTRAVENOUS

## 2019-12-17 MED ORDER — CLOPIDOGREL BISULFATE 75 MG PO TABS
75.0000 mg | ORAL_TABLET | Freq: Every day | ORAL | Status: DC
  Administered 2019-12-18: 75 mg via ORAL
  Filled 2019-12-17: qty 1

## 2019-12-17 MED ORDER — ACETAMINOPHEN 160 MG/5ML PO SOLN: 650.0000 mg | ORAL | Status: DC | PRN

## 2019-12-17 MED ORDER — STROKE: EARLY STAGES OF RECOVERY BOOK: Freq: Once | Status: DC

## 2019-12-17 NOTE — H&P (Signed)
Christian Lloyd:[id number] DOB: 02/25/1934 DOA: 12/17/2019     PCP: Richmond Campbell., PA-C   Outpatient Specialists:   CARDS:   Dr. Wyline Mood at [facility]    Patient arrived to ER on 12/17/19 at 2010 Referred by Attending Blane Ohara, MD   Patient coming from: home Lives   With family    Chief Complaint:   Chief Complaint  Patient presents with  . Code Stroke    HPI: Christian Lloyd is a 86 y.o. male with medical history significant of  CAd sp CABG in 2018, NSTEMI, A.fib perioperative , prediabetes    Presented with   at1930 was eating dinner had episode of being unable to speak, left arm neglect. Code stroke activated NIH1 on arrival  Ct head negative CTA head showing diffuse disease currently symptoms resolved except for slight tingling on the right finger tips Associated chest pain or shortness of breath no fevers no chills.  Patient very active at baseline.  Never had any history of CVA before Patient on Plavix and statin for history of CAD Infectious risk factors:  Reports none   Has  been vaccinated against COVID    Initial COVID TEST   in house  PCR testing  Pending  Lab Results  Component Value Date   SARSCOV2NAA NEGATIVE 01/11/2019     Regarding pertinent Chronic problems:     Hyperlipidemia -  on statins Crestor    HTN on metoprolol   CAD  - On Aspirin, statin, betablocker, Plavix                 -  followed by cardiology                - last cardiac cath  2018  showed multivessel disease pt had CABG x2 04/17/2017,  free right internal mammary artery to the first obtuse marginal from left internal mammary artery as a Y-graft  left internal mammary artery to the left anterior descending from in-situ left internal mammary artery   DM 2 -   diet controlled       A. Fib -  -  CHA2DS2/VAS Stroke Risk Points  >3      Not on anticoagulation secondary to brief episode        -  Rate control:  Currently controlled with  Metoprolol,           Off  amiodarone    While in ER: Ct [place]D [place] CTA head diffuse disease  ER Provider Called:  Neurology   Dr.Aroor  They Recommend admit to medicine   Will see  in ER  Hospitalist was called for admission for TIA vs CVA  The following Work up has been ordered so far:  Orders Placed This Encounter  Procedures  . CT HEAD CODE STROKE WO CONTRAST  . CT ANGIO HEAD CODE STROKE  . CT ANGIO NECK CODE STROKE  . Protime-INR  . APTT  . CBC  . Differential  . Comprehensive metabolic panel  . Urinalysis, Routine w reflex microscopic  . Diet NPO time specified  . Cardiac monitoring  . Stroke swallow screen  . NIH Stroke Scale  . Modified Stroke Scale (mNIHSS) Document mNIHSS assessment every 2 hours for a total of 12 hours  . Saline Lock IV, Maintain IV access  . If O2 Sat <94% administer O2 at 2 liters/minute via nasal cannula  . Catherize if unable to void  . Dagoberto Reef to [employer] [phone]  .  Consult to Neuro Hospitalist  ALL PATIENTS BEING ADMITTED/HAVING PROCEDURES NEED COVID-19 SCREENING  . Consult to Neuro Hospitalist  ALL PATIENTS BEING ADMITTED/HAVING PROCEDURES NEED COVID-19 SCREENING  . Consult to hospitalist  ALL PATIENTS BEING ADMITTED/HAVING PROCEDURES NEED COVID-19 SCREENING  . Pulse oximetry, continuous  . I-stat chem 8, ED  . CBG monitoring, ED  . ED EKG  . EKG 12-Lead     Following Medications were ordered in ER: Medications  sodium chloride flush (NS) 0.9 % injection 3 mL (has no administration in time range)  iohexol (OMNIPAQUE) 350 MG/ML injection 75 mL (75 mLs Intravenous Contrast Given 12/17/19 2043)        Consult Orders  (From admission, onward)         Start     Ordered   12/17/19 2218  Consult to Neuro Hospitalist  ALL PATIENTS BEING ADMITTED/HAVING PROCEDURES NEED COVID-19 SCREENING  Once       Comments: ALL PATIENTS BEING ADMITTED/HAVING PROCEDURES NEED COVID-19 SCREENING  Provider:  (Not yet assigned)  Question Answer  Comment  Place call to: Neuro Hospitalist on call   Reason for Consult Admit      12/17/19 2217   12/17/19 2218  Consult to hospitalist  ALL PATIENTS BEING ADMITTED/HAVING PROCEDURES NEED COVID-19 SCREENING  Once       Comments: ALL PATIENTS BEING ADMITTED/HAVING PROCEDURES NEED COVID-19 SCREENING  Provider:  (Not yet assigned)  Question Answer Comment  Place call to: [facility]   Reason for Consult Admit      12/17/19 2217   12/17/19 2144  Consult to Neuro Hospitalist  ALL PATIENTS BEING ADMITTED/HAVING PROCEDURES NEED COVID-19 SCREENING  Once       Comments: ALL PATIENTS BEING ADMITTED/HAVING PROCEDURES NEED COVID-19 SCREENING  Provider:  (Not yet assigned)  Question Answer Comment  Place call to: Neuro Hospitalist on call   Reason for Consult Admit      12/17/19 2143            Significant initial  Findings: Abnormal Labs Reviewed  COMPREHENSIVE METABOLIC PANEL - Abnormal; Notable for the following components:      Result Value   Glucose, Bld 203 (*)    All other components within normal limits  I-STAT CHEM 8, ED - Abnormal; Notable for the following components:   Glucose, Bld 195 (*)    Calcium, Ion 1.05 (*)    All other components within normal limits    Otherwise labs showing:    Recent Labs  Lab 12/17/19 2013 12/17/19 2021  NA 138 141  K 3.8 3.8  CO2 23  --   GLUCOSE 203* 195*  BUN 16 18  CREATININE 1.06 1.00  CALCIUM 9.1  --     Cr    Stable,  Lab Results  Component Value Date   CREATININE 1.00 12/17/2019   CREATININE 1.06 12/17/2019   CREATININE 0.82 01/11/2019    Recent Labs  Lab 12/17/19 2013  AST 17  ALT 15  ALKPHOS 47  BILITOT 0.8  PROT 6.6  ALBUMIN 3.9   Lab Results  Component Value Date   CALCIUM 9.1 12/17/2019      WBC      Component Value Date/Time   WBC 8.5 12/17/2019 2013   ANC    Component Value Date/Time   NEUTROABS 3.8 12/17/2019 2013   ALC No components found for: LYMPHAB    Plt: Lab Results    Component Value Date   PLT 212 12/17/2019  COVID-19 Labs  No results for input(s): DDIMER, FERRITIN, LDH, CRP in the last 72 hours.  Lab Results  Component Value Date   SARSCOV2NAA NEGATIVE 01/11/2019     HG/HCT   stable,      Component Value Date/Time   HGB 15.0 12/17/2019 2021   HCT 44.0 12/17/2019 2021    No results for input(s): LIPASE, AMYLASE in the last 168 hours. No results for input(s): AMMONIA in the last 168 hours.      ECG: Ordered Personally reviewed by me showing: HR : 84 Rhythm:  NSR, , RBBB,    no evidence of ischemic changes QTC 485       UA  no evidence of UTI     Urine analysis:    Component Value Date/Time   COLORURINE YELLOW 12/17/2019 2136   APPEARANCEUR CLEAR 12/17/2019 2136   LABSPEC >1.046 (H) 12/17/2019 2136   PHURINE 5.0 12/17/2019 2136   GLUCOSEU NEGATIVE 12/17/2019 2136   HGBUR NEGATIVE 12/17/2019 2136   BILIRUBINUR NEGATIVE 12/17/2019 2136   KETONESUR NEGATIVE 12/17/2019 2136   PROTEINUR NEGATIVE 12/17/2019 2136   NITRITE NEGATIVE 12/17/2019 2136   LEUKOCYTESUR NEGATIVE 12/17/2019 2136       Ordered  CT HEAD NON acute  CXR - mild atelectasis     ED Triage Vitals [12/17/19 2055]  Enc Vitals Group     BP (!) 156/80     Pulse Rate 82     Resp 18     Temp 98.1 F (36.7 C)     Temp Source Oral     SpO2 99 %     Weight      Height      Head Circumference      Peak Flow      Pain Score 0     Pain Loc      Pain Edu?      Excl. in GC?   ZCHY(85)@       Latest  Blood pressure (!) 158/67, pulse 84, temperature 98.1 F (36.7 C), temperature source Oral, resp. rate 19, SpO2 96 %.   Review of Systems:    Pertinent positives include:   localizing neurological complaints  Constitutional:  No weight loss, night sweats, Fevers, chills, fatigue, weight loss  HEENT:  No headaches, Difficulty swallowing,Tooth/dental problems,Sore throat,  No sneezing, itching, ear ache, nasal congestion, post nasal drip,   Cardio-vascular:  No chest pain, Orthopnea, PND, anasarca, dizziness, palpitations.no Bilateral lower extremity swelling  GI:  No heartburn, indigestion, abdominal pain, nausea, vomiting, diarrhea, change in bowel habits, loss of appetite, melena, blood in stool, hematemesis Resp:  no shortness of breath at rest. No dyspnea on exertion, No excess mucus, no productive cough, No non-productive cough, No coughing up of blood.No change in color of mucus.No wheezing. Skin:  no rash or lesions. No jaundice GU:  no dysuria, change in color of urine, no urgency or frequency. No straining to urinate.  No flank pain.  Musculoskeletal:  No joint pain or no joint swelling. No decreased range of motion. No back pain.  Psych:  No change in mood or affect. No depression or anxiety. No memory loss.  Neuro: no, no tingling, no weakness, no double vision, no gait abnormality, no slurred speech, no confusion  All systems reviewed and apart from HOPI all are negative  Past Medical History:   Past Medical History:  Diagnosis Date  . Dysrhythmia   . Myocardial infarction [facility]) 04/2017      Past Surgical  History:  Procedure Laterality Date  . CIRCUMCISION N/A 01/13/2019   Procedure: CIRCUMCISION ADULT;  Surgeon: Marcine Matar, MD;  Location: [facility];  Service: Urology;  Laterality: N/A;  30 MINS  . CORONARY ANGIOPLASTY  04/2017   [facility]    Social History:  Ambulatory   Independently      reports that he quit smoking about 23 years ago. His smoking use included cigarettes. He has never used smokeless tobacco. He reports that he does not drink alcohol and does not use drugs.   Family History:    Family History  Problem Relation Age of Onset  . Diabetes Other   . CAD Other   . Hypertension Other     Allergies: No Known Allergies   Prior to Admission medications   Medication Sig Start Date End Date Taking? Authorizing Provider  acetaminophen (TYLENOL) 325 MG tablet Take 325 mg  by mouth at bedtime as needed for headache (pain).   Yes [provider]  aspirin EC 81 MG tablet Take 81 mg by mouth daily before breakfast.    Yes [provider]  Calcium Carbonate Antacid (TUMS PO) Take 1 tablet by mouth 2 (two) times daily as needed (heartburn).   Yes [provider]  CALCIUM PO Take 1 tablet by mouth daily.   Yes [provider]  clopidogrel (PLAVIX) 75 MG tablet Take 75 mg by mouth daily before breakfast.  11/28/19  Yes [provider]  Flaxseed, Linseed, (FLAX SEED OIL PO) Take 1 capsule by mouth daily.   Yes [provider]  metoprolol tartrate (LOPRESSOR) 25 MG tablet Take 25 mg by mouth 2 (two) times daily with a meal.    Yes [provider]  rosuvastatin (CRESTOR) 20 MG tablet Take 20 mg by mouth daily with supper.    Yes [provider]  traMADol (ULTRAM) 50 MG tablet Take 50 mg by mouth at bedtime as needed (pain).  11/16/19  Yes [provider]   Physical Exam: Vitals with BMI 12/17/2019 12/17/2019 01/13/2019  Height - - -  Weight - - -  BMI - - -  Systolic 158 156 638  Diastolic 67 80 62  Pulse 84 82 62    1. General:  in No Acute distress    Chronically ill  -appearing 2. Psychological: Alert and   Oriented 3. Head/ENT:    Dry Mucous Membranes                          Head Non traumatic, neck supple                            Poor Dentition 4. SKIN:  decreased Skin turgor,  Skin clean Dry and intact no rash 5. Heart: Regular rate and rhythm no Murmur, no Rub or gallop 6. Lungs: , no wheezes or crackles   7. Abdomen: Soft,  non-tender,  Distended   Obese bowel sounds present 8. Lower extremities: no clubbing, cyanosis, no  edema 9. Neurologically  strength 5 out of 5 in all 4 extremities cranial nerves II through XII intact 10. MSK: Normal range of motion   All other LABS:     Recent Labs  Lab 12/17/19 2013 12/17/19 2021  WBC 8.5  --   NEUTROABS 3.8  --   HGB 15.1 15.0    HCT 46.5 44.0  MCV 89.9  --   PLT 212  --  Recent Labs  Lab 12/17/19 2013 12/17/19 2021  NA 138 141  K 3.8 3.8  CL 103 101  CO2 23  --   GLUCOSE 203* 195*  BUN 16 18  CREATININE 1.06 1.00  CALCIUM 9.1  --      Recent Labs  Lab 12/17/19 2013  AST 17  ALT 15  ALKPHOS 47  BILITOT 0.8  PROT 6.6  ALBUMIN 3.9       Cultures: No results found for: SDES, SPECREQUEST, CULT, REPTSTATUS   Radiological Exams on Admission: DG CHEST PORT 1 VIEW  Result Date: 12/17/2019 CLINICAL DATA:  Stroke. EXAM: PORTABLE CHEST 1 VIEW COMPARISON:  None. FINDINGS: Postoperative changes in the mediastinum. Borderline heart size with normal pulmonary vascularity. No consolidation or edema in the lungs. No pleural effusions. No pneumothorax. Possible mild atelectasis in the left base. Mediastinal contours appear intact. Calcification of the aorta. IMPRESSION: Possible mild atelectasis in the left base. Electronically Signed   By: Burman Nieves M.D.   On: 12/17/2019 23:48   CT HEAD CODE STROKE WO CONTRAST  Result Date: 12/17/2019 CLINICAL DATA:  Code stroke.  Acute neuro deficit.  Aphasia EXAM: CT HEAD WITHOUT CONTRAST TECHNIQUE: Contiguous axial images were obtained from the base of the skull through the vertex without intravenous contrast. COMPARISON:  None. FINDINGS: Brain: Motion degraded study. Generalized atrophy. Mild ventricular enlargement consistent with atrophy. Patchy white matter hypodensity bilaterally appears chronic. Negative for acute infarct, hemorrhage, mass. Prominent calcification of the falx and tentorium bilaterally. Vascular: Negative for hyperdense vessel Skull: Negative Sinuses/Orbits: Mild mucosal edema paranasal sinuses. Bilateral cataract extraction. Other: None ASPECTS (Alberta Stroke Program Early CT Score) - Ganglionic level infarction (caudate, lentiform nuclei, internal capsule, insula, M1-M3 cortex): 7 - Supraganglionic infarction (M4-M6 cortex): 3 Total score (0-10  with 10 being normal): 10 IMPRESSION: 1. No acute abnormality 2. ASPECTS is 10 3. Atrophy and chronic microvascular ischemic change in the white matter. Motion degraded study. 4. Preliminary report texted to Dr. Laurence Slate Electronically Signed   By: Marlan Palau M.D.   On: 12/17/2019 20:31   CT ANGIO HEAD CODE STROKE  Result Date: 12/17/2019 CLINICAL DATA:  Focal neuro deficit suspect stroke.  Aphasia. EXAM: CT ANGIOGRAPHY HEAD AND NECK TECHNIQUE: Multidetector CT imaging of the head and neck was performed using the standard protocol during bolus administration of intravenous contrast. Multiplanar CT image reconstructions and MIPs were obtained to evaluate the vascular anatomy. Carotid stenosis measurements (when applicable) are obtained utilizing NASCET criteria, using the distal internal carotid diameter as the denominator. CONTRAST:  15mL OMNIPAQUE IOHEXOL 350 MG/ML SOLN COMPARISON:  CT head 12/17/2019 FINDINGS: CTA NECK FINDINGS Aortic arch: Standard branching. Imaged portion shows no evidence of aneurysm or dissection. No significant stenosis of the major arch vessel origins. Atherosclerotic aortic arch. Right carotid system: Atherosclerotic calcification right carotid bifurcation. Right internal carotid artery narrowed by approximately 25% diameter stenosis. Left carotid system: Atherosclerotic disease left carotid bifurcation extending into the proximal left internal carotid artery. Left internal carotid artery narrowed by approximately 50% diameter stenosis. Vertebral arteries: Left vertebral artery dominant. Both vertebral arteries patent to the basilar without significant stenosis. Skeleton: Cervical spondylosis. Median sternotomy. No acute skeletal abnormality. Other neck: Negative for mass or adenopathy in the neck. Upper chest: Apical emphysema without acute abnormality. Review of the MIP images confirms the above findings CTA HEAD FINDINGS Anterior circulation: Atherosclerotic calcification in the  cavernous carotid bilaterally. Mild stenosis on the right. No significant stenosis on the left. Anterior and middle  cerebral arteries patent bilaterally without large vessel occlusion or flow limiting stenosis. There is mild atherosclerotic disease in the M1 segment bilaterally. Posterior circulation: Mild atherosclerotic disease in both vertebral arteries at the skull base. PICA origin not visualized bilaterally. There is AICA bilaterally which is patent which supplies the PICA territory. Mild atherosclerotic disease in the basilar without significant stenosis. Superior cerebellar artery patent bilaterally. Right posterior cerebral artery patent proximally. There is diffuse disease in the P2 and P3 segment which is most severe in the P3 segment. Proximal left posterior cerebral artery patent. There is a moderate stenosis proximal left P2 segment and a severe stenosis left P3 segment Venous sinuses: Normal venous enhancement Anatomic variants: None Review of the MIP images confirms the above findings IMPRESSION: 1. Moderate stenosis left P2 segment and severe stenosis left P3 segment. 2. Severe stenosis right P3 segment. 3. Negative for intracranial large vessel occlusion. 4. Mild atherosclerotic narrowing of the M1 segment bilaterally. 5. 25% diameter stenosis proximal right internal carotid artery. 6. 50% diameter stenosis proximal left internal carotid artery 7. Both vertebral arteries widely patent in the neck. Electronically Signed   By: Marlan Palau M.D.   On: 12/17/2019 21:03   CT ANGIO NECK CODE STROKE  Result Date: 12/17/2019 CLINICAL DATA:  Focal neuro deficit suspect stroke.  Aphasia. EXAM: CT ANGIOGRAPHY HEAD AND NECK TECHNIQUE: Multidetector CT imaging of the head and neck was performed using the standard protocol during bolus administration of intravenous contrast. Multiplanar CT image reconstructions and MIPs were obtained to evaluate the vascular anatomy. Carotid stenosis measurements (when  applicable) are obtained utilizing NASCET criteria, using the distal internal carotid diameter as the denominator. CONTRAST:  75mL OMNIPAQUE IOHEXOL 350 MG/ML SOLN COMPARISON:  CT head 12/17/2019 FINDINGS: CTA NECK FINDINGS Aortic arch: Standard branching. Imaged portion shows no evidence of aneurysm or dissection. No significant stenosis of the major arch vessel origins. Atherosclerotic aortic arch. Right carotid system: Atherosclerotic calcification right carotid bifurcation. Right internal carotid artery narrowed by approximately 25% diameter stenosis. Left carotid system: Atherosclerotic disease left carotid bifurcation extending into the proximal left internal carotid artery. Left internal carotid artery narrowed by approximately 50% diameter stenosis. Vertebral arteries: Left vertebral artery dominant. Both vertebral arteries patent to the basilar without significant stenosis. Skeleton: Cervical spondylosis. Median sternotomy. No acute skeletal abnormality. Other neck: Negative for mass or adenopathy in the neck. Upper chest: Apical emphysema without acute abnormality. Review of the MIP images confirms the above findings CTA HEAD FINDINGS Anterior circulation: Atherosclerotic calcification in the cavernous carotid bilaterally. Mild stenosis on the right. No significant stenosis on the left. Anterior and middle cerebral arteries patent bilaterally without large vessel occlusion or flow limiting stenosis. There is mild atherosclerotic disease in the M1 segment bilaterally. Posterior circulation: Mild atherosclerotic disease in both vertebral arteries at the skull base. PICA origin not visualized bilaterally. There is AICA bilaterally which is patent which supplies the PICA territory. Mild atherosclerotic disease in the basilar without significant stenosis. Superior cerebellar artery patent bilaterally. Right posterior cerebral artery patent proximally. There is diffuse disease in the P2 and P3 segment which is  most severe in the P3 segment. Proximal left posterior cerebral artery patent. There is a moderate stenosis proximal left P2 segment and a severe stenosis left P3 segment Venous sinuses: Normal venous enhancement Anatomic variants: None Review of the MIP images confirms the above findings IMPRESSION: 1. Moderate stenosis left P2 segment and severe stenosis left P3 segment. 2. Severe stenosis right P3 segment. 3. Negative  for intracranial large vessel occlusion. 4. Mild atherosclerotic narrowing of the M1 segment bilaterally. 5. 25% diameter stenosis proximal right internal carotid artery. 6. 50% diameter stenosis proximal left internal carotid artery 7. Both vertebral arteries widely patent in the neck. Electronically Signed   By: Marlan Palau M.D.   On: 12/17/2019 21:03    Chart has been reviewed    Assessment/Plan  86 y.o. male with medical history significant of  CAd sp CABG in 2018, NSTEMI, A.fib perioperative , prediabetes Admitted for TIA vs CVA  Present on Admission: . TIA (transient ischemic attack) -  - will admit based on TIA/CVA protocol, Initial Stroke scale 1        Monitor on Tele       MRA/MRI await  Results        CTA  Done showing diffuse desease       Echo to evaluate for possible embolic source,        obtain cardiac enzymes,  ECG,   Lipid panel, TSH.        Order PT/OT evaluation.      keep nothing by mouth until passes swallow eval         Will make sure patient is on antiplatelet ASA 81   Plavix    and statin  Defer to neurology if any adjustment can be made       Allow permissive Hypertension keep BP <220/120        Neurology consulted by Er Provider awaiting results of MRI  . CAD (coronary artery disease) -  - chronic, continue aspirin   and statin  and restart beta blocker when able. Pt was on Plavix will conitnue   . Essential hypertension - allow permissive hypertension for now  . Hyperlipemia - stable continue home meds  . AF (paroxysmal atrial  fibrillation) (HCC) was isolated event perioperatively in the setting of CABG.  Currently resolved.  Patient was on amiodarone but currently it was discontinued remains on beta-blocker.  Was not anticoagulated this was an isolated event. May need further evaluation given neurological event.  DM2 -diet controlled at home while hospitalized order Sensitive  SSI   -  check TSH and HgA1C   Other plan as per orders.  DVT prophylaxis:  SCD         Code Status:    Code Status: Full Code FULL CODE as per patient   I had personally discussed CODE STATUS with patient     Family Communication:   Family   at  Bedside  plan of care was discussed  with  Son,  Disposition Plan:     To home once workup is complete and patient is stable   Following barriers for discharge:                            CVA work up completed                           Will need consultants to evaluate patient prior to discharge                      Would benefit from PT/OT eval prior to DC  Ordered  Consults called: ER provider called neurology     Admission status:  ED Disposition    ED Disposition Condition Comment   Admit  Hospital Area: [facility] [[id number]]  Level of Care: Telemetry Medical [104]  I expect the patient will be discharged within 24 hours: No (not a candidate for 5C-Observation unit)  Covid Evaluation: Asymptomatic Screening Protocol (No Symptoms)  Diagnosis: TIA (transient ischemic attack) [161096]  Admitting Physician: Therisa Doyne [[id number]]  Attending Physician: Therisa Doyne [[id number]]  Bed request comments: neurology        Obs   Level of care    Tele  indefinitely please discontinue once patient no longer qualifies COVID-19 Labs    Lab Results  Component Value Date   SARSCOV2NAA NEGATIVE 01/11/2019     Precautions: admitted as  asymptomatic screening protocol    PPE: Used by the provider:   P100  eye Goggles,    Gloves     Marche Hottenstein 12/17/2019, 11:57 PM    [facility]     after 2 AM please page floor coverage PA If 7AM-7PM, please contact the day team taking care of the patient using [email or web address]   Patient was evaluated in the context of the global COVID-19 pandemic, which necessitated consideration that the patient might be at risk for infection with the SARS-CoV-2 virus that causes COVID-19. Institutional protocols and algorithms that pertain to the evaluation of patients at risk for COVID-19 are in a state of rapid change based on information released by regulatory bodies including the CDC and federal and state organizations. These policies and algorithms were followed during the patient's care.

## 2019-12-17 NOTE — ED Provider Notes (Signed)
[facility] EMERGENCY DEPARTMENT Provider Note   CSN: [id number] Arrival date & time: 12/17/19  2010  An emergency department physician performed an initial assessment on this suspected stroke patient at 2012.  History Chief Complaint  Patient presents with  . Code Stroke    Arjay Jaskiewicz is a 86 y.o. male.  Patient with history of diabetes, CAD, high blood pressure presents with concern for stroke.  Patient was at dinner with family and suddenly became unable to speak, neglect on the left arm.  Last known normal 730 this evening.  No history of known stroke.  Patient is on Plavix.  Patient has improved since that time.        Past Medical History:  Diagnosis Date  . Dysrhythmia   . Myocardial infarction [facility]) 04/2017    Patient Active Problem List   Diagnosis Date Noted  . TIA (transient ischemic attack) 12/17/2019  . DM (diabetes mellitus), type 2 (HCC) 12/17/2019  . CAD (coronary artery disease) 12/17/2019  . Essential hypertension 12/17/2019  . Hyperlipemia 12/17/2019    Past Surgical History:  Procedure Laterality Date  . CIRCUMCISION N/A 01/13/2019   Procedure: CIRCUMCISION ADULT;  Surgeon: Marcine Matar, MD;  Location: [facility];  Service: Urology;  Laterality: N/A;  30 MINS  . CORONARY ANGIOPLASTY  04/2017   [facility]       Family History  Problem Relation Age of Onset  . Diabetes Other   . CAD Other   . Hypertension Other     Social History   Tobacco Use  . Smoking status: Former Smoker    Types: Cigarettes    Quit date: 1998    Years since quitting: 23.6  . Smokeless tobacco: Never Used  Substance Use Topics  . Alcohol use: Never  . Drug use: Never    Home Medications Prior to Admission medications   Medication Sig Start Date End Date Taking? Authorizing Provider  acetaminophen (TYLENOL) 325 MG tablet Take 325 mg by mouth at bedtime as needed for headache (pain).   Yes [provider]  aspirin EC 81 MG  tablet Take 81 mg by mouth daily before breakfast.    Yes [provider]  Calcium Carbonate Antacid (TUMS PO) Take 1 tablet by mouth 2 (two) times daily as needed (heartburn).   Yes [provider]  CALCIUM PO Take 1 tablet by mouth daily.   Yes [provider]  clopidogrel (PLAVIX) 75 MG tablet Take 75 mg by mouth daily before breakfast.  11/28/19  Yes [provider]  Flaxseed, Linseed, (FLAX SEED OIL PO) Take 1 capsule by mouth daily.   Yes [provider]  metoprolol tartrate (LOPRESSOR) 25 MG tablet Take 25 mg by mouth 2 (two) times daily with a meal.    Yes [provider]  rosuvastatin (CRESTOR) 20 MG tablet Take 20 mg by mouth daily with supper.    Yes [provider]  traMADol (ULTRAM) 50 MG tablet Take 50 mg by mouth at bedtime as needed (pain).  11/16/19  Yes [provider]    Allergies    Patient has no known allergies.  Review of Systems   Review of Systems  Constitutional: Negative for chills and fever.  HENT: Negative for congestion.   Eyes: Negative for visual disturbance.  Respiratory: Negative for shortness of breath.   Cardiovascular: Negative for chest pain.  Gastrointestinal: Negative for abdominal pain and vomiting.  Genitourinary: Negative for dysuria and flank pain.  Musculoskeletal: Negative  for back pain, neck pain and neck stiffness.  Skin: Negative for rash.  Neurological: Positive for speech difficulty, weakness and numbness. Negative for light-headedness and headaches.    Physical Exam Updated Vital Signs BP (!) 169/83   Pulse 78   Temp 98.1 F (36.7 C) (Oral)   Resp 18   SpO2 94%   Physical Exam Vitals and nursing note reviewed.  Constitutional:      Appearance: He is well-developed.  HENT:     Head: Normocephalic and atraumatic.  Eyes:     General:        Right eye: No discharge.        Left eye: No discharge.     Conjunctiva/sclera: Conjunctivae normal.  Neck:      Trachea: No tracheal deviation.  Cardiovascular:     Rate and Rhythm: Normal rate and regular rhythm.  Pulmonary:     Effort: Pulmonary effort is normal.     Breath sounds: Normal breath sounds.  Abdominal:     General: There is no distension.     Palpations: Abdomen is soft.     Tenderness: There is no abdominal tenderness. There is no guarding.  Musculoskeletal:     Cervical back: Normal range of motion and neck supple.  Skin:    General: Skin is warm.     Findings: No rash.  Neurological:     Mental Status: He is alert and oriented to person, place, and time.     Comments: 5+ strength in UE and LE with f/e at major joints. Sensation to palpation intact in UE and LE. CNs 2-12 grossly intact.  EOMFI.  PERRL.   Finger nose and coordination intact bilateral.   No nystagmus      ED Results / Procedures / Treatments   Labs (all labs ordered are listed, but only abnormal results are displayed) Labs Reviewed  COMPREHENSIVE METABOLIC PANEL - Abnormal; Notable for the following components:      Result Value   Glucose, Bld 203 (*)    All other components within normal limits  URINALYSIS, ROUTINE W REFLEX MICROSCOPIC - Abnormal; Notable for the following components:   Specific Gravity, Urine >1.046 (*)    All other components within normal limits  I-STAT CHEM 8, ED - Abnormal; Notable for the following components:   Glucose, Bld 195 (*)    Calcium, Ion 1.05 (*)    All other components within normal limits  CBG MONITORING, ED - Abnormal; Notable for the following components:   Glucose-Capillary 120 (*)    All other components within normal limits  SARS CORONAVIRUS 2 BY RT PCR (HOSPITAL ORDER, PERFORMED IN [facility])  PROTIME-INR  APTT  CBC  DIFFERENTIAL  HEMOGLOBIN A1C  LIPID PANEL  URINALYSIS, DIPSTICK ONLY  TROPONIN I (HIGH SENSITIVITY)    EKG None  Radiology DG CHEST PORT 1 VIEW  Result Date: 12/17/2019 CLINICAL DATA:  Stroke. EXAM: PORTABLE CHEST 1  VIEW COMPARISON:  None. FINDINGS: Postoperative changes in the mediastinum. Borderline heart size with normal pulmonary vascularity. No consolidation or edema in the lungs. No pleural effusions. No pneumothorax. Possible mild atelectasis in the left base. Mediastinal contours appear intact. Calcification of the aorta. IMPRESSION: Possible mild atelectasis in the left base. Electronically Signed   By: Burman Nieves M.D.   On: 12/17/2019 23:48   CT HEAD CODE STROKE WO CONTRAST  Result Date: 12/17/2019 CLINICAL DATA:  Code stroke.  Acute neuro deficit.  Aphasia EXAM: CT HEAD WITHOUT CONTRAST  TECHNIQUE: Contiguous axial images were obtained from the base of the skull through the vertex without intravenous contrast. COMPARISON:  None. FINDINGS: Brain: Motion degraded study. Generalized atrophy. Mild ventricular enlargement consistent with atrophy. Patchy white matter hypodensity bilaterally appears chronic. Negative for acute infarct, hemorrhage, mass. Prominent calcification of the falx and tentorium bilaterally. Vascular: Negative for hyperdense vessel Skull: Negative Sinuses/Orbits: Mild mucosal edema paranasal sinuses. Bilateral cataract extraction. Other: None ASPECTS (Alberta Stroke Program Early CT Score) - Ganglionic level infarction (caudate, lentiform nuclei, internal capsule, insula, M1-M3 cortex): 7 - Supraganglionic infarction (M4-M6 cortex): 3 Total score (0-10 with 10 being normal): 10 IMPRESSION: 1. No acute abnormality 2. ASPECTS is 10 3. Atrophy and chronic microvascular ischemic change in the white matter. Motion degraded study. 4. Preliminary report texted to Dr. Laurence Slate Electronically Signed   By: Marlan Palau M.D.   On: 12/17/2019 20:31   CT ANGIO HEAD CODE STROKE  Result Date: 12/17/2019 CLINICAL DATA:  Focal neuro deficit suspect stroke.  Aphasia. EXAM: CT ANGIOGRAPHY HEAD AND NECK TECHNIQUE: Multidetector CT imaging of the head and neck was performed using the standard protocol during  bolus administration of intravenous contrast. Multiplanar CT image reconstructions and MIPs were obtained to evaluate the vascular anatomy. Carotid stenosis measurements (when applicable) are obtained utilizing NASCET criteria, using the distal internal carotid diameter as the denominator. CONTRAST:  11mL OMNIPAQUE IOHEXOL 350 MG/ML SOLN COMPARISON:  CT head 12/17/2019 FINDINGS: CTA NECK FINDINGS Aortic arch: Standard branching. Imaged portion shows no evidence of aneurysm or dissection. No significant stenosis of the major arch vessel origins. Atherosclerotic aortic arch. Right carotid system: Atherosclerotic calcification right carotid bifurcation. Right internal carotid artery narrowed by approximately 25% diameter stenosis. Left carotid system: Atherosclerotic disease left carotid bifurcation extending into the proximal left internal carotid artery. Left internal carotid artery narrowed by approximately 50% diameter stenosis. Vertebral arteries: Left vertebral artery dominant. Both vertebral arteries patent to the basilar without significant stenosis. Skeleton: Cervical spondylosis. Median sternotomy. No acute skeletal abnormality. Other neck: Negative for mass or adenopathy in the neck. Upper chest: Apical emphysema without acute abnormality. Review of the MIP images confirms the above findings CTA HEAD FINDINGS Anterior circulation: Atherosclerotic calcification in the cavernous carotid bilaterally. Mild stenosis on the right. No significant stenosis on the left. Anterior and middle cerebral arteries patent bilaterally without large vessel occlusion or flow limiting stenosis. There is mild atherosclerotic disease in the M1 segment bilaterally. Posterior circulation: Mild atherosclerotic disease in both vertebral arteries at the skull base. PICA origin not visualized bilaterally. There is AICA bilaterally which is patent which supplies the PICA territory. Mild atherosclerotic disease in the basilar without  significant stenosis. Superior cerebellar artery patent bilaterally. Right posterior cerebral artery patent proximally. There is diffuse disease in the P2 and P3 segment which is most severe in the P3 segment. Proximal left posterior cerebral artery patent. There is a moderate stenosis proximal left P2 segment and a severe stenosis left P3 segment Venous sinuses: Normal venous enhancement Anatomic variants: None Review of the MIP images confirms the above findings IMPRESSION: 1. Moderate stenosis left P2 segment and severe stenosis left P3 segment. 2. Severe stenosis right P3 segment. 3. Negative for intracranial large vessel occlusion. 4. Mild atherosclerotic narrowing of the M1 segment bilaterally. 5. 25% diameter stenosis proximal right internal carotid artery. 6. 50% diameter stenosis proximal left internal carotid artery 7. Both vertebral arteries widely patent in the neck. Electronically Signed   By: Marlan Palau M.D.   On: 12/17/2019  21:03   CT ANGIO NECK CODE STROKE  Result Date: 12/17/2019 CLINICAL DATA:  Focal neuro deficit suspect stroke.  Aphasia. EXAM: CT ANGIOGRAPHY HEAD AND NECK TECHNIQUE: Multidetector CT imaging of the head and neck was performed using the standard protocol during bolus administration of intravenous contrast. Multiplanar CT image reconstructions and MIPs were obtained to evaluate the vascular anatomy. Carotid stenosis measurements (when applicable) are obtained utilizing NASCET criteria, using the distal internal carotid diameter as the denominator. CONTRAST:  54mL OMNIPAQUE IOHEXOL 350 MG/ML SOLN COMPARISON:  CT head 12/17/2019 FINDINGS: CTA NECK FINDINGS Aortic arch: Standard branching. Imaged portion shows no evidence of aneurysm or dissection. No significant stenosis of the major arch vessel origins. Atherosclerotic aortic arch. Right carotid system: Atherosclerotic calcification right carotid bifurcation. Right internal carotid artery narrowed by approximately 25% diameter  stenosis. Left carotid system: Atherosclerotic disease left carotid bifurcation extending into the proximal left internal carotid artery. Left internal carotid artery narrowed by approximately 50% diameter stenosis. Vertebral arteries: Left vertebral artery dominant. Both vertebral arteries patent to the basilar without significant stenosis. Skeleton: Cervical spondylosis. Median sternotomy. No acute skeletal abnormality. Other neck: Negative for mass or adenopathy in the neck. Upper chest: Apical emphysema without acute abnormality. Review of the MIP images confirms the above findings CTA HEAD FINDINGS Anterior circulation: Atherosclerotic calcification in the cavernous carotid bilaterally. Mild stenosis on the right. No significant stenosis on the left. Anterior and middle cerebral arteries patent bilaterally without large vessel occlusion or flow limiting stenosis. There is mild atherosclerotic disease in the M1 segment bilaterally. Posterior circulation: Mild atherosclerotic disease in both vertebral arteries at the skull base. PICA origin not visualized bilaterally. There is AICA bilaterally which is patent which supplies the PICA territory. Mild atherosclerotic disease in the basilar without significant stenosis. Superior cerebellar artery patent bilaterally. Right posterior cerebral artery patent proximally. There is diffuse disease in the P2 and P3 segment which is most severe in the P3 segment. Proximal left posterior cerebral artery patent. There is a moderate stenosis proximal left P2 segment and a severe stenosis left P3 segment Venous sinuses: Normal venous enhancement Anatomic variants: None Review of the MIP images confirms the above findings IMPRESSION: 1. Moderate stenosis left P2 segment and severe stenosis left P3 segment. 2. Severe stenosis right P3 segment. 3. Negative for intracranial large vessel occlusion. 4. Mild atherosclerotic narrowing of the M1 segment bilaterally. 5. 25% diameter  stenosis proximal right internal carotid artery. 6. 50% diameter stenosis proximal left internal carotid artery 7. Both vertebral arteries widely patent in the neck. Electronically Signed   By: Marlan Palau M.D.   On: 12/17/2019 21:03    Procedures .Critical Care Performed by: Blane Ohara, MD Authorized by: Blane Ohara, MD   Critical care provider statement:    Critical care time (minutes):  40   Critical care start time:  12/17/2019 10:50 PM   Critical care end time:  12/17/2019 11:30 PM   Critical care time was exclusive of:  Separately billable procedures and treating other patients and teaching time   Critical care was necessary to treat or prevent imminent or life-threatening deterioration of the following conditions:  CNS failure or compromise   Critical care was time spent personally by me on the following activities:  Discussions with consultants, examination of patient, ordering and performing treatments and interventions, ordering and review of laboratory studies, ordering and review of radiographic studies, pulse oximetry, re-evaluation of patient's condition and obtaining history from patient or surrogate   (including critical  care time)  Medications Ordered in ED Medications  sodium chloride flush (NS) 0.9 % injection 3 mL (has no administration in time range)  insulin aspart (novoLOG) injection 0-9 Units (has no administration in time range)  aspirin EC tablet 81 mg (has no administration in time range)  clopidogrel (PLAVIX) tablet 75 mg (has no administration in time range)  rosuvastatin (CRESTOR) tablet 20 mg (has no administration in time range)   stroke: mapping our early stages of recovery book (has no administration in time range)  0.9 %  sodium chloride infusion (has no administration in time range)  acetaminophen (TYLENOL) tablet 650 mg (has no administration in time range)    Or  acetaminophen (TYLENOL) 160 MG/5ML solution 650 mg (has no administration in time  range)    Or  acetaminophen (TYLENOL) suppository 650 mg (has no administration in time range)  iohexol (OMNIPAQUE) 350 MG/ML injection 75 mL (75 mLs Intravenous Contrast Given 12/17/19 2043)    ED Course  I have reviewed the triage vital signs and the nursing notes.  Pertinent labs & imaging results that were available during my care of the patient were reviewed by me and considered in my medical decision making (see chart for details).    MDM Rules/Calculators/A&P                          Patient presents with concern for stroke.  Patient has significant deficits that have gradually improved on examination in the ER.  Patient received CT scan immediately and blood work.  CT scan results reviewed showing significant vascular disease on CT angiogram.  No bleeding. Blood work showed glucose around 200, normal hemoglobin, normal white blood cell count.  Urinalysis no signs of infection, mild concentrated  Patient doing well on reassessment, discussed with neurology plan for MRI admission.  Paged hospitalist for admission.  TPA not given as patient improved however initially considering.    Final Clinical Impression(s) / ED Diagnoses Final diagnoses:  TIA (transient ischemic attack)    Rx / DC Orders ED Discharge Orders    None       Blane Ohara, MD 12/17/19 2356

## 2019-12-17 NOTE — Code Documentation (Addendum)
Responded to Code Stroke called at 2003 for AMS, aphasia, [id number]. Pt was eating at [facility] and [employer] with family and suddenly began to stare off into space and would not speak. Pt arrived at 2010. Once in CT scan, pt began speaking. He was very confused and agitated at first but this improved over time. NIH-1 d/t not knowing the month, CT head-negative, CTA-no LVO. Plan for admission, EEG, and MRI.

## 2019-12-17 NOTE — ED Triage Notes (Signed)
Pt arrives via EMS as a code stroke. Pt was LKW at 1930, when he was eating dinner with family and was unable to speak and noted to have neglect in his left arm. Upon arrival, pt NIHSS 1.

## 2019-12-18 ENCOUNTER — Observation Stay (HOSPITAL_COMMUNITY): Payer: Medicare Other

## 2019-12-18 ENCOUNTER — Observation Stay (HOSPITAL_BASED_OUTPATIENT_CLINIC_OR_DEPARTMENT_OTHER): Payer: Medicare Other

## 2019-12-18 DIAGNOSIS — R404 Transient alteration of awareness: Secondary | ICD-10-CM | POA: Diagnosis not present

## 2019-12-18 DIAGNOSIS — I351 Nonrheumatic aortic (valve) insufficiency: Secondary | ICD-10-CM

## 2019-12-18 DIAGNOSIS — I480 Paroxysmal atrial fibrillation: Secondary | ICD-10-CM | POA: Diagnosis not present

## 2019-12-18 DIAGNOSIS — G459 Transient cerebral ischemic attack, unspecified: Secondary | ICD-10-CM | POA: Diagnosis not present

## 2019-12-18 LAB — LIPID PANEL
Cholesterol: 109 mg/dL (ref 0–200)
HDL: 37 mg/dL — ABNORMAL LOW (ref >40)
LDL Cholesterol: 58 mg/dL (ref 0–99)
Total CHOL/HDL Ratio: 2.9 RATIO
Triglycerides: 69 mg/dL (ref <150)
VLDL: 14 mg/dL (ref 0–40)

## 2019-12-18 LAB — TROPONIN I (HIGH SENSITIVITY): Troponin I (High Sensitivity): 9 ng/L (ref <18)

## 2019-12-18 LAB — CBG MONITORING, ED
Glucose-Capillary: 107 mg/dL — ABNORMAL HIGH (ref 70–99)
Glucose-Capillary: 119 mg/dL — ABNORMAL HIGH (ref 70–99)

## 2019-12-18 LAB — SARS CORONAVIRUS 2 BY RT PCR (HOSPITAL ORDER, PERFORMED IN ~~LOC~~ HOSPITAL LAB): SARS Coronavirus 2: NEGATIVE

## 2019-12-18 LAB — ECHOCARDIOGRAM COMPLETE
Area-P 1/2: 4.65 cm2
P 1/2 time: 512 msec
S' Lateral: 3.3 cm

## 2019-12-18 LAB — GLUCOSE, CAPILLARY: Glucose-Capillary: 103 mg/dL — ABNORMAL HIGH (ref 70–99)

## 2019-12-18 MED ORDER — PERFLUTREN LIPID MICROSPHERE
1.0000 mL | INTRAVENOUS | Status: AC | PRN
  Administered 2019-12-18: 2 mL via INTRAVENOUS
  Filled 2019-12-18: qty 10

## 2019-12-18 NOTE — Procedures (Signed)
History: 86 yo M being evaluated for a traisnet alteration of awareness.   Sedation: None  Technique: This is a 21 channel routine scalp EEG performed at the bedside with bipolar and monopolar montages arranged in accordance to the international 10/20 system of electrode placement. One channel was dedicated to EKG recording.    Background: The background consists of intermixed alpha and beta activities. There is a well defined posterior dominant rhythm of 9 Hz that attenuates with eye opening.  There is an increase in irregular delta associated with drowsiness, and sleep is briefly seen and appears normal.  Photic stimulation: Physiologic driving is not performed  EEG Abnormalities: None  Clinical Interpretation: This normal EEG is recorded in the waking and sleep state. There was no seizure or seizure predisposition recorded on this study. Please note that lack of epileptiform activity on EEG does not preclude the possibility of epilepsy.   Ritta Slot, MD [facility] [phone]  If 7pm- 7am, please page neurology on call as listed in AMION.

## 2019-12-18 NOTE — ED Notes (Signed)
Pt transported to MRI 

## 2019-12-18 NOTE — ED Notes (Signed)
Pt ambulatory with one assist to the restroom to have a BM

## 2019-12-18 NOTE — Progress Notes (Signed)
SLP Cancellation Note  Patient Details Name: Christian Lloyd MRN: [id number] DOB: 01/24/1934   Cancelled treatment:       Reason Eval/Treat Not Completed: SLP screened, no needs identified, will sign off. Pt and his son-in-law indicated that the pt's language skills have returned to baseline; no overt communication or cognitive deficits were noted by PT. SLP will sign off.   Sony Schlarb I. Vear Clock, MS, CCC-SLP Acute Rehabilitation Services Office number [phone] Pager [phone]  Scheryl Marten 12/18/2019, 2:41 PM

## 2019-12-18 NOTE — TOC Transition Note (Signed)
Transition of Care [facility]) - CM/SW Discharge Note   Patient Details  Name: Christian Lloyd MRN: [id number] Date of Birth: [birth year]  Transition of Care [facility] CM/SW Contact:  Kermit Balo, RN Phone Number: 12/18/2019, 2:36 PM   Clinical Narrative:    Pt discharging home with self care. No needs per TOC. Pt has transportation home.    Final next level of care: Home/Self Care Barriers to Discharge: No Barriers Identified   Patient Goals and CMS Choice        Discharge Placement                       Discharge Plan and Services                                     Social Determinants of Health (SDOH) Interventions     Readmission Risk Interventions No flowsheet data found.

## 2019-12-18 NOTE — Progress Notes (Signed)
*  PRELIMINARY RESULTS* Echocardiogram 2D Echocardiogram has been performed with Definity.  Stacey Drain 12/18/2019, 2:02 PM

## 2019-12-18 NOTE — Consult Note (Addendum)
Requesting Physician: Dr. Jodi Mourning    Chief Complaint: Aphasia and right-sided weakness  History obtained from: Patient and Chart     HPI:                                                                                                                                       Christian Lloyd is a 86 y.o. male past medical history significant for coronary artery disease status post CABG, NSTEMI, perioperative A. fib not on anticoagulation, prediabetes presents to the emergency department as a code stroke for sudden onset aphasia and neglect.  Last known normal 7:30 PM and patient was eating dinner.  According to EMS patient started to stare off and was nonverbal.  EMS was called and they arrived patient was not able to speak.  There was no drift noted on either side.  Neglect was scored as patient was not able to appreciate touch on either side?Marland Kitchen   On arrival to [facility] patient symptoms had improved significantly.  Slightly confused but no obvious aphasia.  Stat CT head showed no acute findings.  tPA was not given due to rapidly resolving symptoms.  Date last known well: 8.7.21 Time last known well: 7:30 PM tPA Given: No symptoms improved NIHSS: 1 Baseline MRS 0   Past Medical History:  Diagnosis Date  . Dysrhythmia   . Myocardial infarction [facility]) 04/2017    Past Surgical History:  Procedure Laterality Date  . CIRCUMCISION N/A 01/13/2019   Procedure: CIRCUMCISION ADULT;  Surgeon: Marcine Matar, MD;  Location: [facility];  Service: Urology;  Laterality: N/A;  30 MINS  . CORONARY ANGIOPLASTY  04/2017   [facility]    Family History  Problem Relation Age of Onset  . Diabetes Other   . CAD Other   . Hypertension Other    Social History:  reports that he quit smoking about 23 years ago. His smoking use included cigarettes. He has never used smokeless tobacco. He reports that he does not drink alcohol and does not use drugs.  Allergies: No Known Allergies  Medications:                                                                                                                         I reviewed home medications   ROS:  14 systems reviewed and negative except above    Examination:                                                                                                      General: Appears well-developed  Psych: Affect appropriate to situation Eyes: No scleral injection HENT: No OP obstrucion Head: Normocephalic.  Cardiovascular: Normal rate and regular rhythm.  Respiratory: Effort normal and breath sounds normal to anterior ascultation GI: Soft.  No distension. There is no tenderness.  Skin: WDI    Neurological Examination Mental Status: Alert, oriented, thought content appropriate.  Speech fluent without evidence of aphasia. Able to follow 3 step commands without difficulty. Cranial Nerves: II: Visual fields grossly normal,  III,IV, VI: ptosis not present, extra-ocular motions intact bilaterally, pupils equal, round, reactive to light and accommodation V,VII: smile symmetric, facial light touch sensation normal bilaterally VIII: hearing normal bilaterally IX,X: uvula rises symmetrically XI: bilateral shoulder shrug XII: midline tongue extension Motor: Right : Upper extremity   5/5    Left:     Upper extremity   5/5  Lower extremity   5/5     Lower extremity   5/5 Tone and bulk:normal tone throughout; no atrophy noted Sensory: Pinprick and light touch intact throughout, bilaterally Plantars: Right: downgoing   Left: downgoing Cerebellar: normal finger-to-nose,       Lab Results: Basic Metabolic Panel: Recent Labs  Lab 12/17/19 09-14-2011 12/17/19 2021  NA 138 141  K 3.8 3.8  CL 103 101  CO2 23  --   GLUCOSE 203* 195*  BUN 16 18  CREATININE 1.06 1.00  CALCIUM 9.1  --     CBC: Recent Labs  Lab  12/17/19 2013 12/17/19 2021  WBC 8.5  --   NEUTROABS 3.8  --   HGB 15.1 15.0  HCT 46.5 44.0  MCV 89.9  --   PLT 212  --     Coagulation Studies: Recent Labs    12/17/19 Sep 14, 2011  LABPROT 13.1  INR 1.0    Imaging: MR BRAIN WO CONTRAST  Result Date: 12/18/2019 CLINICAL DATA:  Speech changes.  Possible stroke. EXAM: MRI HEAD WITHOUT CONTRAST TECHNIQUE: Multiplanar, multiecho pulse sequences of the brain and surrounding structures were obtained without intravenous contrast. COMPARISON:  None. FINDINGS: BRAIN: No acute infarct, acute hemorrhage or extra-axial collection. Multifocal white matter hyperintensity, most commonly due to chronic ischemic microangiopathy. There is generalized atrophy without lobar predilection. No chronic microhemorrhage. Normal midline structures. VASCULAR: Major flow voids are preserved. SKULL AND UPPER CERVICAL SPINE: Normal calvarium and skull base. Visualized upper cervical spine and soft tissues are normal. SINUSES/ORBITS: No paranasal sinus fluid levels or advanced mucosal thickening. No mastoid or middle ear effusion. Normal orbits. IMPRESSION: 1. No acute intracranial abnormality. 2. Generalized atrophy without lobar predilection. Mild chronic small vessel disease. Electronically Signed   By: Deatra Robinson M.D.   On: 12/18/2019 01:03   DG CHEST PORT 1 VIEW  Result Date: 12/17/2019 CLINICAL DATA:  Stroke. EXAM: PORTABLE CHEST 1 VIEW COMPARISON:  None. FINDINGS: Postoperative changes in the mediastinum. Borderline  heart size with normal pulmonary vascularity. No consolidation or edema in the lungs. No pleural effusions. No pneumothorax. Possible mild atelectasis in the left base. Mediastinal contours appear intact. Calcification of the aorta. IMPRESSION: Possible mild atelectasis in the left base. Electronically Signed   By: Burman Nieves M.D.   On: 12/17/2019 23:48   CT HEAD CODE STROKE WO CONTRAST  Result Date: 12/17/2019 CLINICAL DATA:  Code stroke.  Acute  neuro deficit.  Aphasia EXAM: CT HEAD WITHOUT CONTRAST TECHNIQUE: Contiguous axial images were obtained from the base of the skull through the vertex without intravenous contrast. COMPARISON:  None. FINDINGS: Brain: Motion degraded study. Generalized atrophy. Mild ventricular enlargement consistent with atrophy. Patchy white matter hypodensity bilaterally appears chronic. Negative for acute infarct, hemorrhage, mass. Prominent calcification of the falx and tentorium bilaterally. Vascular: Negative for hyperdense vessel Skull: Negative Sinuses/Orbits: Mild mucosal edema paranasal sinuses. Bilateral cataract extraction. Other: None ASPECTS (Alberta Stroke Program Early CT Score) - Ganglionic level infarction (caudate, lentiform nuclei, internal capsule, insula, M1-M3 cortex): 7 - Supraganglionic infarction (M4-M6 cortex): 3 Total score (0-10 with 10 being normal): 10 IMPRESSION: 1. No acute abnormality 2. ASPECTS is 10 3. Atrophy and chronic microvascular ischemic change in the white matter. Motion degraded study. 4. Preliminary report texted to Dr. Laurence Slate Electronically Signed   By: Marlan Palau M.D.   On: 12/17/2019 20:31   CT ANGIO HEAD CODE STROKE  Result Date: 12/17/2019 CLINICAL DATA:  Focal neuro deficit suspect stroke.  Aphasia. EXAM: CT ANGIOGRAPHY HEAD AND NECK TECHNIQUE: Multidetector CT imaging of the head and neck was performed using the standard protocol during bolus administration of intravenous contrast. Multiplanar CT image reconstructions and MIPs were obtained to evaluate the vascular anatomy. Carotid stenosis measurements (when applicable) are obtained utilizing NASCET criteria, using the distal internal carotid diameter as the denominator. CONTRAST:  75mL OMNIPAQUE IOHEXOL 350 MG/ML SOLN COMPARISON:  CT head 12/17/2019 FINDINGS: CTA NECK FINDINGS Aortic arch: Standard branching. Imaged portion shows no evidence of aneurysm or dissection. No significant stenosis of the major arch vessel  origins. Atherosclerotic aortic arch. Right carotid system: Atherosclerotic calcification right carotid bifurcation. Right internal carotid artery narrowed by approximately 25% diameter stenosis. Left carotid system: Atherosclerotic disease left carotid bifurcation extending into the proximal left internal carotid artery. Left internal carotid artery narrowed by approximately 50% diameter stenosis. Vertebral arteries: Left vertebral artery dominant. Both vertebral arteries patent to the basilar without significant stenosis. Skeleton: Cervical spondylosis. Median sternotomy. No acute skeletal abnormality. Other neck: Negative for mass or adenopathy in the neck. Upper chest: Apical emphysema without acute abnormality. Review of the MIP images confirms the above findings CTA HEAD FINDINGS Anterior circulation: Atherosclerotic calcification in the cavernous carotid bilaterally. Mild stenosis on the right. No significant stenosis on the left. Anterior and middle cerebral arteries patent bilaterally without large vessel occlusion or flow limiting stenosis. There is mild atherosclerotic disease in the M1 segment bilaterally. Posterior circulation: Mild atherosclerotic disease in both vertebral arteries at the skull base. PICA origin not visualized bilaterally. There is AICA bilaterally which is patent which supplies the PICA territory. Mild atherosclerotic disease in the basilar without significant stenosis. Superior cerebellar artery patent bilaterally. Right posterior cerebral artery patent proximally. There is diffuse disease in the P2 and P3 segment which is most severe in the P3 segment. Proximal left posterior cerebral artery patent. There is a moderate stenosis proximal left P2 segment and a severe stenosis left P3 segment Venous sinuses: Normal venous enhancement Anatomic variants: None Review  of the MIP images confirms the above findings IMPRESSION: 1. Moderate stenosis left P2 segment and severe stenosis left P3  segment. 2. Severe stenosis right P3 segment. 3. Negative for intracranial large vessel occlusion. 4. Mild atherosclerotic narrowing of the M1 segment bilaterally. 5. 25% diameter stenosis proximal right internal carotid artery. 6. 50% diameter stenosis proximal left internal carotid artery 7. Both vertebral arteries widely patent in the neck. Electronically Signed   By: Marlan Palau M.D.   On: 12/17/2019 21:03   CT ANGIO NECK CODE STROKE  Result Date: 12/17/2019 CLINICAL DATA:  Focal neuro deficit suspect stroke.  Aphasia. EXAM: CT ANGIOGRAPHY HEAD AND NECK TECHNIQUE: Multidetector CT imaging of the head and neck was performed using the standard protocol during bolus administration of intravenous contrast. Multiplanar CT image reconstructions and MIPs were obtained to evaluate the vascular anatomy. Carotid stenosis measurements (when applicable) are obtained utilizing NASCET criteria, using the distal internal carotid diameter as the denominator. CONTRAST:  52mL OMNIPAQUE IOHEXOL 350 MG/ML SOLN COMPARISON:  CT head 12/17/2019 FINDINGS: CTA NECK FINDINGS Aortic arch: Standard branching. Imaged portion shows no evidence of aneurysm or dissection. No significant stenosis of the major arch vessel origins. Atherosclerotic aortic arch. Right carotid system: Atherosclerotic calcification right carotid bifurcation. Right internal carotid artery narrowed by approximately 25% diameter stenosis. Left carotid system: Atherosclerotic disease left carotid bifurcation extending into the proximal left internal carotid artery. Left internal carotid artery narrowed by approximately 50% diameter stenosis. Vertebral arteries: Left vertebral artery dominant. Both vertebral arteries patent to the basilar without significant stenosis. Skeleton: Cervical spondylosis. Median sternotomy. No acute skeletal abnormality. Other neck: Negative for mass or adenopathy in the neck. Upper chest: Apical emphysema without acute abnormality.  Review of the MIP images confirms the above findings CTA HEAD FINDINGS Anterior circulation: Atherosclerotic calcification in the cavernous carotid bilaterally. Mild stenosis on the right. No significant stenosis on the left. Anterior and middle cerebral arteries patent bilaterally without large vessel occlusion or flow limiting stenosis. There is mild atherosclerotic disease in the M1 segment bilaterally. Posterior circulation: Mild atherosclerotic disease in both vertebral arteries at the skull base. PICA origin not visualized bilaterally. There is AICA bilaterally which is patent which supplies the PICA territory. Mild atherosclerotic disease in the basilar without significant stenosis. Superior cerebellar artery patent bilaterally. Right posterior cerebral artery patent proximally. There is diffuse disease in the P2 and P3 segment which is most severe in the P3 segment. Proximal left posterior cerebral artery patent. There is a moderate stenosis proximal left P2 segment and a severe stenosis left P3 segment Venous sinuses: Normal venous enhancement Anatomic variants: None Review of the MIP images confirms the above findings IMPRESSION: 1. Moderate stenosis left P2 segment and severe stenosis left P3 segment. 2. Severe stenosis right P3 segment. 3. Negative for intracranial large vessel occlusion. 4. Mild atherosclerotic narrowing of the M1 segment bilaterally. 5. 25% diameter stenosis proximal right internal carotid artery. 6. 50% diameter stenosis proximal left internal carotid artery 7. Both vertebral arteries widely patent in the neck. Electronically Signed   By: Marlan Palau M.D.   On: 12/17/2019 21:03     ASSESSMENT AND PLAN   86 y.o. male past medical history significant for coronary artery disease status post CABG, NSTEMI, perioperative A. fib with sudden onset aphasia that mostly resolved.  Also noted to be staring off into space.  CT head negative for acute findings and CTA shows bilateral  severe PCA stenosis.  Suspect either patient had TIA versus seizure.  Transient ischemic attack versus seizure  Recommend # EEG # MRI of the brain without contrast: No acute stroke #Transthoracic Echo  # Start patient on ASA 325mg  daily #Start or continue Atorvastatin 40 mg/other high intensity statin # BP goal: permissive HTN upto 220/120 mmHg ( 185/110 if patient has CHF, CKD) # HBAIC and Lipid profile # Telemetry monitoring # Frequent neuro checks # NPO until passes stroke swallow screen  Please page stroke NP  Or  PA  Or MD from 8am -4 pm  as this patient from this time will be  followed by the stroke.   You can look them up on [email or web address]  Password [facility]   Vala Raffo [facility] Pager Number [phone]

## 2019-12-18 NOTE — Discharge Summary (Addendum)
Physician Discharge Summary  Patient ID: Christian Lloyd MRN: [id number] DOB/AGE: 84/22/1935 84 y.o.  Admit date: 12/17/2019 Discharge date: 12/18/2019  Admission Diagnoses:  Discharge Diagnoses:  Active Problems:   Likely complex partial seizure.     Possible TIA (transient ischemic attack)   DM (diabetes mellitus), type 2 (HCC)   CAD (coronary artery disease)   Essential hypertension   Hyperlipemia   AF (paroxysmal atrial fibrillation) (HCC)   Discharged Condition: stable  Hospital Course: Patient is an 86 year old male with past medical history significant for coronary artery disease status CABG in 2018, NSTEMI, atrial fibrillation and prediabetes.  Patient was admitted with concerns for possible TIA.  Apparently, patient became absent-minded during the family denied was staring.  No motor activity.  Patient was admitted and worked up for possible TIA.  MRI of the brain is nonrevealing.  CT angio head and neck blood nonrevealing.  CT head stroke protocol is nonrevealing.  Neurology team was consulted to assist with patient's management.  Patient underwent EEG and this came back normal.  Patient has undergone echocardiogram and the result is pending.  Neurology team has advised discharging patient if the echo is nonrevealing.  Current opinion is that patient may have had complex partial seizure.  Patient is back to baseline.  No motor deficits.  Patient will be discharged back home if echocardiogram is nonrevealing.  Patient will follow with primary care provider and neurology within 1 week of discharge.  Consults: neurology  Significant Diagnostic Studies:  MRI brain revealed:  1. No acute intracranial abnormality. 2. Generalized atrophy without lobar predilection. Mild chronic small vessel disease.  CT angio head and neck revealed: 1. Moderate stenosis left P2 segment and severe stenosis left P3 segment. 2. Severe stenosis right P3 segment. 3. Negative for intracranial large vessel  occlusion. 4. Mild atherosclerotic narrowing of the M1 segment bilaterally. 5. 25% diameter stenosis proximal right internal carotid artery. 6. 50% diameter stenosis proximal left internal carotid artery 7. Both vertebral arteries widely patent in the neck.  CT head stroke protocol revealed: 1. No acute abnormality 2. ASPECTS is 10 3. Atrophy and chronic microvascular ischemic change in the white matter. Motion degraded study.  EEG: -Negative.  Echocardiogram: Final report is pending.  Discharge Exam: Blood pressure (!) 149/73, pulse 80, temperature 98.1 F (36.7 C), temperature source Oral, resp. rate 16, SpO2 98 %.   Disposition: Discharge disposition: 01-Home or Self Care   Discharge Instructions    Diet - low sodium heart healthy   Complete by: As directed    Increase activity slowly   Complete by: As directed      Allergies as of 12/18/2019   No Known Allergies     Medication List    STOP taking these medications   traMADol 50 MG tablet Commonly known as: ULTRAM     TAKE these medications   acetaminophen 325 MG tablet Commonly known as: TYLENOL Take 325 mg by mouth at bedtime as needed for headache (pain).   aspirin EC 81 MG tablet Take 81 mg by mouth daily before breakfast.   CALCIUM PO Take 1 tablet by mouth daily.   clopidogrel 75 MG tablet Commonly known as: PLAVIX Take 75 mg by mouth daily before breakfast.   FLAX SEED OIL PO Take 1 capsule by mouth daily.   metoprolol tartrate 25 MG tablet Commonly known as: LOPRESSOR Take 25 mg by mouth 2 (two) times daily with a meal.   rosuvastatin 20 MG tablet Commonly known as: CRESTOR  Take 20 mg by mouth daily with supper.   TUMS PO Take 1 tablet by mouth 2 (two) times daily as needed (heartburn).        SignedBarnetta Chapel 12/18/2019, 2:34 PM   Addendum: Echo report: 1. Left ventricular ejection fraction, by estimation, is 45 to 50%. The  left ventricle has mildly decreased  function. The left ventricle  demonstrates regional wall motion abnormalities (see scoring  diagram/findings for description). Left ventricular  diastolic parameters are consistent with Grade I diastolic dysfunction  (impaired relaxation).  2. Right ventricular systolic function is moderately reduced. The right  ventricular size is normal. Tricuspid regurgitation signal is inadequate  for assessing PA pressure.  3. The mitral valve is normal in structure. Trivial mitral valve  regurgitation. No evidence of mitral stenosis.  4. The aortic valve is tricuspid. Aortic valve regurgitation is mild. No  aortic stenosis is present.  5. The inferior vena cava is normal in size with <50% respiratory  variability, suggesting right atrial pressure of 8 mmHg.   Conclusion(s)/Recommendation(s): No intracardiac source of embolism  detected on this transthoracic study. A transesophageal echocardiogram is  recommended to exclude cardiac source of embolism if clinically indicated.   Discussed echocardiogram finding with the cardiology team.  Cardiology has also cleared patient for discharge.  Patient will need to follow-up with his cardiologist at [facility].

## 2019-12-18 NOTE — ED Notes (Signed)
Ordered a heart healthy breakfast--Christian Lloyd  

## 2019-12-18 NOTE — Progress Notes (Signed)
EEG complete - results pending 

## 2019-12-18 NOTE — Evaluation (Signed)
Physical Therapy Evaluation Patient Details Name: Christian Lloyd MRN: [id number] DOB: 1933/08/27 Today's Date: 12/18/2019   History of Present Illness  Pt presented with aphasia and neglect. CT and MRI negative for acute findings. Symptoms rapidly resolving. TIA vs seizure. PMH - CAD, CABG, NSTEMI, afib.  Clinical Impression  Pt doing well with mobility and no further PT needed.  Ready for dc from PT standpoint.      Follow Up Recommendations No PT follow up    Equipment Recommendations  None recommended by PT    Recommendations for Other Services       Precautions / Restrictions Precautions Precautions: None      Mobility  Bed Mobility               General bed mobility comments: Sitting up on edge of stretcher  Transfers Overall transfer level: Modified independent Equipment used: None                Ambulation/Gait Ambulation/Gait assistance: Modified independent (Device/Increase time) Gait Distance (Feet): 250 Feet Assistive device: None Gait Pattern/deviations: Step-through pattern;Decreased stride length Gait velocity: decr Gait velocity interpretation: 1.31 - 2.62 ft/sec, indicative of limited community ambulator General Gait Details: Steady gait. No sign of inattention or neglect  Stairs            Wheelchair Mobility    Modified Rankin (Stroke Patients Only) Modified Rankin (Stroke Patients Only) Pre-Morbid Rankin Score: No symptoms Modified Rankin: No symptoms     Balance Overall balance assessment: Mild deficits observed, not formally tested                                           Pertinent Vitals/Pain Pain Assessment: No/denies pain    Home Living Family/patient expects to be discharged to:: Private residence Living Arrangements: Children Available Help at Discharge: Family Type of Home: House Home Access: Stairs to enter Entrance Stairs-Rails: Right Entrance Stairs-Number of Steps: 3-4 Home Layout:  One level Home Equipment: Cane - single point      Prior Function Level of Independence: Independent with assistive device(s)         Comments: Uses cane when out. No falls in last 6 months     Hand Dominance   Dominant Hand: Right    Extremity/Trunk Assessment   Upper Extremity Assessment Upper Extremity Assessment: Overall WFL for tasks assessed    Lower Extremity Assessment Lower Extremity Assessment: Overall WFL for tasks assessed       Communication   Communication: No difficulties  Cognition Arousal/Alertness: Awake/alert Behavior During Therapy: WFL for tasks assessed/performed Overall Cognitive Status: Within Functional Limits for tasks assessed                                        General Comments      Exercises     Assessment/Plan    PT Assessment Patent does not need any further PT services  PT Problem List         PT Treatment Interventions      PT Goals (Current goals can be found in the Care Plan section)  Acute Rehab PT Goals PT Goal Formulation: All assessment and education complete, DC therapy    Frequency     Barriers to discharge  Co-evaluation               AM-PAC PT "6 Clicks" Mobility  Outcome Measure Help needed turning from your back to your side while in a flat bed without using bedrails?: None Help needed moving from lying on your back to sitting on the side of a flat bed without using bedrails?: None Help needed moving to and from a bed to a chair (including a wheelchair)?: None Help needed standing up from a chair using your arms (e.g., wheelchair or bedside chair)?: None Help needed to walk in hospital room?: None Help needed climbing 3-5 steps with a railing? : None 6 Click Score: 24    End of Session   Activity Tolerance: Patient tolerated treatment well Patient left: in bed;with family/visitor present Nurse Communication: Mobility status;Other (comment) (pt urinated in bathroom  while up) PT Visit Diagnosis: Other abnormalities of gait and mobility (R26.89)    Time: 8786-7672 PT Time Calculation (min) (ACUTE ONLY): 20 min   Charges:   PT Evaluation $PT Eval Low Complexity: 1 Low          [facility] PT Acute Rehabilitation Services Pager [phone] Office [phone]   Angelina Ok [facility] 12/18/2019, 9:09 AM

## 2019-12-18 NOTE — Progress Notes (Signed)
OT Cancellation Note  Patient Details Name: Christian Lloyd MRN: [id number] DOB: March 27, 1934   Cancelled Treatment:    Reason Eval/Treat Not Completed: OT screened, no needs identified, will sign off (Per PT pt and family confirm that pt is back to baseline. No signs of visual deficits or neglect at this time. OT Will sign off, thank you for this consult.)  Dalphine Handing, MSOT, OTR/L Acute Rehabilitation Services [facility] Office Number: [phone] Pager: [phone]  Dalphine Handing 12/18/2019, 9:14 AM

## 2019-12-18 NOTE — Progress Notes (Signed)
Subjective: The patient remembers going to eat at [facility], but does not remember the ambulance arriving last night.  So there is clearly on amnestic.  Exam: Vitals:   12/18/19 0700 12/18/19 0801  BP: (!) 155/77 (!) 147/58  Pulse: 83 71  Resp: 19 19  Temp:    SpO2: 94% 94%   Gen: In bed, NAD Resp: non-labored breathing, no acute distress Abd: soft, nt  Neuro: MS: Awake, alert, interactive and appropriate CN: Pupils equal round reactive, extraocular movements intact, visual fields full Motor: Moves all extremities well Sensory: Intact to light touch  Pertinent Labs: LDL 58, okay A1c 7.2   Impression: 86 year old male with transient alteration of awareness.  Description sounds more like complex partial seizure to me than TIA, but with out a clear semiology and no findings on EEG confirming seizure predisposition, I would not favor starting antiepileptic therapy at this time.  If he continues to have further events, however, then I do think empiric therapy would be prudent.  Recommendations: 1) would inform patient not drive until cleared by outpatient neurologist. 2) continue antiplatelet therapy with aspirin 81 mg daily 3) no need to change statin therapy 4) given his degree of atherosclerotic disease, including CNS disease, I do think a goal A1c less than seven would be prudent. 5) I do not think I would use this event to call his 50% left ICA symptomatic. 6) I would not start antiepileptic therapy at this time, though if he has any further episodes of concern, would have low threshold to start it at the time. 7) if the echo reveals a clear cardioembolic source, then may need to reconsider TIA as a diagnosis, otherwise no further recommendations from neurology at this time.  Ritta Slot, MD [facility] [phone]  If 7pm- 7am, please page neurology on call as listed in AMION.

## 2019-12-22 ENCOUNTER — Other Ambulatory Visit: Payer: Self-pay

## 2019-12-22 ENCOUNTER — Encounter (HOSPITAL_COMMUNITY): Payer: Self-pay | Admitting: *Deleted

## 2019-12-22 ENCOUNTER — Observation Stay (HOSPITAL_COMMUNITY)
Admission: EM | Admit: 2019-12-22 | Discharge: 2019-12-23 | Disposition: A | Discharge status: Home / Self Care | Payer: Medicare Other | Attending: Family Medicine | Admitting: Family Medicine

## 2019-12-22 ENCOUNTER — Emergency Department (HOSPITAL_COMMUNITY): Payer: Medicare Other

## 2019-12-22 DIAGNOSIS — I119 Hypertensive heart disease without heart failure: Secondary | ICD-10-CM | POA: Insufficient documentation

## 2019-12-22 DIAGNOSIS — E119 Type 2 diabetes mellitus without complications: Secondary | ICD-10-CM | POA: Diagnosis not present

## 2019-12-22 DIAGNOSIS — R4182 Altered mental status, unspecified: Secondary | ICD-10-CM | POA: Diagnosis not present

## 2019-12-22 DIAGNOSIS — G40209 Localization-related (focal) (partial) symptomatic epilepsy and epileptic syndromes with complex partial seizures, not intractable, without status epilepticus: Secondary | ICD-10-CM | POA: Diagnosis not present

## 2019-12-22 DIAGNOSIS — G9341 Metabolic encephalopathy: Secondary | ICD-10-CM | POA: Diagnosis not present

## 2019-12-22 DIAGNOSIS — R4689 Other symptoms and signs involving appearance and behavior: Secondary | ICD-10-CM | POA: Insufficient documentation

## 2019-12-22 DIAGNOSIS — I480 Paroxysmal atrial fibrillation: Secondary | ICD-10-CM | POA: Diagnosis not present

## 2019-12-22 DIAGNOSIS — I10 Essential (primary) hypertension: Secondary | ICD-10-CM | POA: Diagnosis present

## 2019-12-22 DIAGNOSIS — R569 Unspecified convulsions: Secondary | ICD-10-CM

## 2019-12-22 DIAGNOSIS — I2510 Atherosclerotic heart disease of native coronary artery without angina pectoris: Secondary | ICD-10-CM | POA: Diagnosis not present

## 2019-12-22 DIAGNOSIS — Z7982 Long term (current) use of aspirin: Secondary | ICD-10-CM | POA: Diagnosis not present

## 2019-12-22 DIAGNOSIS — Z87891 Personal history of nicotine dependence: Secondary | ICD-10-CM | POA: Insufficient documentation

## 2019-12-22 DIAGNOSIS — Z20822 Contact with and (suspected) exposure to covid-19: Secondary | ICD-10-CM | POA: Diagnosis not present

## 2019-12-22 LAB — GLUCOSE, CAPILLARY: Glucose-Capillary: 110 mg/dL — ABNORMAL HIGH (ref 70–99)

## 2019-12-22 LAB — CBC
HCT: 47 % (ref 39.0–52.0)
Hemoglobin: 15.4 g/dL (ref 13.0–17.0)
MCH: 29.6 pg (ref 26.0–34.0)
MCHC: 32.8 g/dL (ref 30.0–36.0)
MCV: 90.4 fL (ref 80.0–100.0)
Platelets: 209 10*3/uL (ref 150–400)
RBC: 5.2 MIL/uL (ref 4.22–5.81)
RDW: 13.7 % (ref 11.5–15.5)
WBC: 7.6 10*3/uL (ref 4.0–10.5)
nRBC: 0 % (ref 0.0–0.2)

## 2019-12-22 LAB — BASIC METABOLIC PANEL
Anion gap: 10 (ref 5–15)
BUN: 17 mg/dL (ref 8–23)
CO2: 26 mmol/L (ref 22–32)
Calcium: 9.6 mg/dL (ref 8.9–10.3)
Chloride: 103 mmol/L (ref 98–111)
Creatinine, Ser: 1.01 mg/dL (ref 0.61–1.24)
GFR calc Af Amer: >60 mL/min (ref >60)
GFR calc non Af Amer: >60 mL/min (ref >60)
Glucose, Bld: 113 mg/dL — ABNORMAL HIGH (ref 70–99)
Potassium: 4.4 mmol/L (ref 3.5–5.1)
Sodium: 139 mmol/L (ref 135–145)

## 2019-12-22 LAB — LACTIC ACID, PLASMA: Lactic Acid, Venous: 1.5 mmol/L (ref 0.5–1.9)

## 2019-12-22 LAB — CK: Total CK: 78 U/L (ref 49–397)

## 2019-12-22 LAB — SARS CORONAVIRUS 2 BY RT PCR (HOSPITAL ORDER, PERFORMED IN ~~LOC~~ HOSPITAL LAB): SARS Coronavirus 2: NEGATIVE

## 2019-12-22 MED ORDER — ALBUTEROL SULFATE (2.5 MG/3ML) 0.083% IN NEBU: 2.5000 mg | INHALATION_SOLUTION | RESPIRATORY_TRACT | Status: DC | PRN

## 2019-12-22 MED ORDER — LEVETIRACETAM IN NACL 500 MG/100ML IV SOLN
500.0000 mg | Freq: Once | INTRAVENOUS | Status: AC
  Administered 2019-12-22: 500 mg via INTRAVENOUS
  Filled 2019-12-22: qty 100

## 2019-12-22 MED ORDER — ACETAMINOPHEN 325 MG PO TABS: 325.0000 mg | ORAL_TABLET | Freq: Every evening | ORAL | Status: DC | PRN

## 2019-12-22 MED ORDER — TRAZODONE HCL 50 MG PO TABS: 50.0000 mg | ORAL_TABLET | Freq: Every evening | ORAL | Status: DC | PRN

## 2019-12-22 MED ORDER — CLOPIDOGREL BISULFATE 75 MG PO TABS
75.0000 mg | ORAL_TABLET | Freq: Every day | ORAL | Status: DC
  Administered 2019-12-23: 75 mg via ORAL
  Filled 2019-12-22: qty 1

## 2019-12-22 MED ORDER — SODIUM CHLORIDE 0.9% FLUSH
3.0000 mL | Freq: Two times a day (BID) | INTRAVENOUS | Status: DC
  Administered 2019-12-23 (×2): 3 mL via INTRAVENOUS

## 2019-12-22 MED ORDER — INSULIN ASPART 100 UNIT/ML ~~LOC~~ SOLN: 0.0000 [IU] | Freq: Three times a day (TID) | SUBCUTANEOUS | Status: DC

## 2019-12-22 MED ORDER — HEPARIN SODIUM (PORCINE) 5000 UNIT/ML IJ SOLN
5000.0000 [IU] | Freq: Three times a day (TID) | INTRAMUSCULAR | Status: DC
  Administered 2019-12-22 – 2019-12-23 (×2): 5000 [IU] via SUBCUTANEOUS
  Filled 2019-12-22 (×2): qty 1

## 2019-12-22 MED ORDER — SODIUM CHLORIDE 0.9% FLUSH: 3.0000 mL | INTRAVENOUS | Status: DC | PRN

## 2019-12-22 MED ORDER — ASPIRIN EC 81 MG PO TBEC
81.0000 mg | DELAYED_RELEASE_TABLET | Freq: Every day | ORAL | Status: DC
  Administered 2019-12-23: 81 mg via ORAL
  Filled 2019-12-22: qty 1

## 2019-12-22 MED ORDER — ROSUVASTATIN CALCIUM 20 MG PO TABS: 20.0000 mg | ORAL_TABLET | Freq: Every day | ORAL | Status: DC

## 2019-12-22 MED ORDER — ONDANSETRON HCL 4 MG/2ML IJ SOLN: 4.0000 mg | Freq: Four times a day (QID) | INTRAMUSCULAR | Status: DC | PRN

## 2019-12-22 MED ORDER — INSULIN ASPART 100 UNIT/ML ~~LOC~~ SOLN: 0.0000 [IU] | Freq: Every day | SUBCUTANEOUS | Status: DC

## 2019-12-22 MED ORDER — METOPROLOL TARTRATE 50 MG PO TABS
25.0000 mg | ORAL_TABLET | Freq: Two times a day (BID) | ORAL | Status: DC
  Administered 2019-12-23: 25 mg via ORAL
  Filled 2019-12-22: qty 1

## 2019-12-22 MED ORDER — POLYETHYLENE GLYCOL 3350 17 G PO PACK: 17.0000 g | PACK | Freq: Every day | ORAL | Status: DC | PRN

## 2019-12-22 MED ORDER — LEVETIRACETAM IN NACL 1000 MG/100ML IV SOLN
1000.0000 mg | Freq: Two times a day (BID) | INTRAVENOUS | Status: DC
  Administered 2019-12-22 – 2019-12-23 (×2): 1000 mg via INTRAVENOUS
  Filled 2019-12-22 (×2): qty 100

## 2019-12-22 MED ORDER — SODIUM CHLORIDE 0.9 % IV SOLN: 250.0000 mL | INTRAVENOUS | Status: DC | PRN

## 2019-12-22 MED ORDER — ONDANSETRON HCL 4 MG PO TABS: 4.0000 mg | ORAL_TABLET | Freq: Four times a day (QID) | ORAL | Status: DC | PRN

## 2019-12-22 NOTE — H&P (Signed)
Patient Demographics:    Christian Lloyd, is a 86 y.o. male  MRN: [id number]   DOB - [birth year]  Admit Date - 12/22/2019  Outpatient Primary MD for the patient is Richmond Campbell., PA-C   Assessment & Plan:    Active Problems:   Acute metabolic encephalopathy    1) presumed complex partial seizures----discussed with neurologist Dr. Gerilyn Pilgrim okay to treat empirically with Keppra -Repeat EEG study -Patient recently had extensive stroke/neuro imaging work-up from a 11/10/2019 through 12/18/2019 -Prolactin pending CK is not elevated -Lactic acid is not elevated -Blood glucose is 113 -No chest pains, no palpitations no dizziness no incontinence no tongue biting  2)coronary artery disease status CABG in 2018--- status post previous NSTEMI--no ACS type symptoms at this time, continue aspirin and Plavix as well as metoprolol and Crestor  3)PAFib--- not on full anticoagulation apparently due to the need to be on DAPT -Continue metoprolol for rate control  4)Prediabetes-- Use Novolog/Humalog Sliding scale insulin with Accu-Cheks/Fingersticks as ordered   Disposition/Need for in-Hospital Stay- patient unable to be discharged at this time due to --- recurrent seizure episodes requiring repeat EEG and further neuro work-up and IV Keppra*   Dispo: The patient is from: Home              Anticipated d/c is to: Home              Anticipated d/c date is: 1 day              Patient currently is not medically stable to d/c. Barriers: Not Clinically Stable-   recurrent seizure episodes requiring repeat EEG and further neuro work-up and IV Keppra*  With History of - Reviewed by me  Past Medical History:  Diagnosis Date  . Dysrhythmia   . Myocardial infarction [facility]) 04/2017      Past Surgical History:  Procedure  Laterality Date  . CIRCUMCISION N/A 01/13/2019   Procedure: CIRCUMCISION ADULT;  Surgeon: Marcine Matar, MD;  Location: [facility];  Service: Urology;  Laterality: N/A;  30 MINS  . CORONARY ANGIOPLASTY  04/2017   [facility]      Chief Complaint  Patient presents with  . Altered Mental Status      HPI:    Christian Lloyd  is a 86 y.o. malewith past medical history significant for coronary artery disease status CABG in 2018, NSTEMI, atrial fibrillation and prediabetes presents with episodes of alteration of mentation -Patient had a similar episode on 12/17/2019 he was admitted to [facility] stroke work-up including extensive neuroimaging studies echo and EEG were unremarkable -Neurology allow time thought patient may be having complex partial seizures she was discharged home on 12/18/2019 -Returns today with similar episode - Patient appears back to baseline at this time according to son-in-law who is at bedside -No chest pains, no palpitations no dizziness no incontinence no tongue biting --Prolactin pending CK is not elevated -Lactic acid is not elevated -  Blood glucose is 113    Review of systems:    In addition to the HPI above,   A full Review of  Systems was done, all other systems reviewed are negative except as noted above in HPI , .    Social History:  Reviewed by me    Social History   Tobacco Use  . Smoking status: Former Smoker    Types: Cigarettes    Quit date: 1998    Years since quitting: 23.6  . Smokeless tobacco: Never Used  Substance Use Topics  . Alcohol use: Never       Family History :  Reviewed by me    Family History  Problem Relation Age of Onset  . Diabetes Other   . CAD Other   . Hypertension Other      Home Medications:   Prior to Admission medications   Medication Sig Start Date End Date Taking? Authorizing Provider  acetaminophen (TYLENOL) 325 MG tablet Take 325 mg by mouth at bedtime as needed for headache (pain).    Yes [provider]  aspirin EC 81 MG tablet Take 81 mg by mouth daily before breakfast.    Yes [provider]  Calcium Carbonate Antacid (TUMS PO) Take 1 tablet by mouth 2 (two) times daily as needed (heartburn).   Yes [provider]  clopidogrel (PLAVIX) 75 MG tablet Take 75 mg by mouth daily before breakfast.  11/28/19  Yes [provider]  Flaxseed, Linseed, (FLAX SEED OIL PO) Take 1 capsule by mouth daily.   Yes [provider]  metoprolol tartrate (LOPRESSOR) 25 MG tablet Take 25 mg by mouth 2 (two) times daily with a meal.    Yes [provider]  rosuvastatin (CRESTOR) 20 MG tablet Take 20 mg by mouth daily with supper.    Yes [provider]     Allergies:    No Known Allergies   Physical Exam:   Vitals  Blood pressure (!) 144/98, pulse 74, temperature 98.5 F (36.9 C), temperature source Oral, resp. rate 17, height 5\' 5"  (1.651 m), weight 63.5 kg, SpO2 94 %.  Physical Examination: General appearance - alert, well appearing, and in no distress Mental status - alert, oriented to person, place, and time,  Eyes - sclera anicteric Neck - supple, no JVD elevation , Chest - clear  to auscultation bilaterally, symmetrical air movement,  Heart - S1 and S2 normal, regular  Abdomen - soft, nontender, nondistended, no masses or organomegaly Neurological - screening mental status exam normal, neck supple without rigidity, cranial nerves II through XII intact, DTR's normal and symmetric Extremities - no pedal edema noted, intact peripheral pulses  Skin - warm, dry     Data Review:    CBC Recent Labs  Lab 12/17/19 2013 12/17/19 2021 12/22/19 1356  WBC 8.5  --  7.6  HGB 15.1 15.0 15.4  HCT 46.5 44.0 47.0  PLT 212  --  209  MCV 89.9  --  90.4  MCH 29.2  --  29.6  MCHC 32.5  --  32.8  RDW 13.6  --  13.7  LYMPHSABS 3.6  --   --   MONOABS 0.8  --   --   EOSABS 0.2  --   --   BASOSABS 0.1  --   --     ------------------------------------------------------------------------------------------------------------------  Chemistries  Recent Labs  Lab 12/17/19 2013 12/17/19 2021 12/22/19 1356  NA 138 141 139  K 3.8 3.8 4.4  CL 103 101 103  CO2 23  --  26  GLUCOSE 203* 195* 113*  BUN 16 18 17   CREATININE 1.06 1.00 1.01  CALCIUM 9.1  --  9.6  AST 17  --   --   ALT 15  --   --   ALKPHOS 47  --   --   BILITOT 0.8  --   --    ------------------------------------------------------------------------------------------------------------------ estimated creatinine clearance is 48.2 mL/min (by C-G formula based on SCr of 1.01 mg/dL). ------------------------------------------------------------------------------------------------------------------ No results for input(s): TSH, T4TOTAL, T3FREE, THYROIDAB in the last 72 hours.  Invalid input(s): FREET3   Coagulation profile Recent Labs  Lab 12/17/19 2013  INR 1.0   ------------------------------------------------------------------------------------------------------------------- No results for input(s): DDIMER in the last 72 hours. -------------------------------------------------------------------------------------------------------------------  Cardiac Enzymes No results for input(s): CKMB, TROPONINI, MYOGLOBIN in the last 168 hours.  Invalid input(s): CK ------------------------------------------------------------------------------------------------------------------ No results found for: BNP   ---------------------------------------------------------------------------------------------------------------  Urinalysis    Component Value Date/Time   COLORURINE YELLOW 12/17/2019 2136   APPEARANCEUR CLEAR 12/17/2019 2136   LABSPEC >1.046 (H) 12/17/2019 2136   PHURINE 5.0 12/17/2019 2136   GLUCOSEU NEGATIVE 12/17/2019 2136   HGBUR NEGATIVE 12/17/2019 2136   BILIRUBINUR NEGATIVE 12/17/2019 2136   KETONESUR NEGATIVE 12/17/2019  2136   PROTEINUR NEGATIVE 12/17/2019 2136   NITRITE NEGATIVE 12/17/2019 2136   LEUKOCYTESUR NEGATIVE 12/17/2019 2136    ----------------------------------------------------------------------------------------------------------------   Imaging Results:    CT HEAD WO CONTRAST  Result Date: 12/22/2019 CLINICAL DATA:  Transient delirium.  Altered mental status. EXAM: CT HEAD WITHOUT CONTRAST TECHNIQUE: Contiguous axial images were obtained from the base of the skull through the vertex without intravenous contrast. COMPARISON:  MRI 12/18/2019 FINDINGS: Brain: Age related atrophy. Chronic small-vessel ischemic changes of the cerebral hemispheric white matter. No sign of acute infarction, mass lesion, hemorrhage, hydrocephalus or extra-axial collection. Patient has a tendency towards dural calcification. Vascular: There is atherosclerotic calcification of the major vessels at the base of the brain. Skull: Negative Sinuses/Orbits: Clear/normal Other: None IMPRESSION: No acute finding by CT. Age related atrophy and chronic small-vessel ischemic changes of the white matter. Electronically Signed   By: 02/17/2020 M.D.   On: 12/22/2019 14:13    Radiological Exams on Admission: CT HEAD WO CONTRAST  Result Date: 12/22/2019 CLINICAL DATA:  Transient delirium.  Altered mental status. EXAM: CT HEAD WITHOUT CONTRAST TECHNIQUE: Contiguous axial images were obtained from the base of the skull through the vertex without intravenous contrast. COMPARISON:  MRI 12/18/2019 FINDINGS: Brain: Age related atrophy. Chronic small-vessel ischemic changes of the cerebral hemispheric white matter. No sign of acute infarction, mass lesion, hemorrhage, hydrocephalus or extra-axial collection. Patient has a tendency towards dural calcification. Vascular: There is atherosclerotic calcification of the major vessels at the base of the brain. Skull: Negative Sinuses/Orbits: Clear/normal Other: None IMPRESSION: No acute finding by CT.  Age related atrophy and chronic small-vessel ischemic changes of the white matter. Electronically Signed   By: 02/17/2020 M.D.   On: 12/22/2019 14:13    DVT Prophylaxis -SCD   AM Labs Ordered, also please review Full Orders  Family Communication: Admission, patients condition and plan of care including tests being ordered have been discussed with the patient and son in law who indicate understanding and agree with the plan   Code Status - Full Code  Likely DC to  home  Condition   stable*  02/21/2020 M.D on 12/22/2019 at 8:24 PM Go to [email or web address] -  for contact info  [facility] - Office  [phone]

## 2019-12-22 NOTE — ED Triage Notes (Signed)
Pt brought in by [facility] with c/o AMS that started approximately 40 minutes ago, LKW 1230. Pt was altered and unable to follow directions or speak. EMS reports pt wouldn't speak, but would just smile, and they were not able to complete stroke scale. Code stroke was called by [facility]. EMS reports about 4 minutes ago before arriving to ED pt became oriented and was following all commands correctly, stroke screen negative. Pt arrived at [facility] and Dr. Pilar Plate evaluated pt while still on EMS stretcher and canceled code stroke.

## 2019-12-22 NOTE — ED Provider Notes (Signed)
[facility] Emergency Department Provider Note MRN:  [id number]  Arrival date & time: 12/22/19     Chief Complaint   Altered Mental Status   History of Present Illness   Christian Lloyd is a 86 y.o. year-old male with a history of MI presenting to the ED with chief complaint of AMS.  EMS called for altered mental status in the home.  Per EMS patient was confused, unable to answer questions or follow commands.  No obvious focal deficits.  On arrival here, patient is feeling better, awake and alert, does not remember what happened, currently feels fine.  Denies any recent fever or illness, no pain.  Had a similar ED visit and admission recently.  Review of Systems  A complete 10 system review of systems was obtained and all systems are negative except as noted in the HPI and PMH.   Patient's Health History    Past Medical History:  Diagnosis Date  . Dysrhythmia   . Myocardial infarction [facility]) 04/2017    Past Surgical History:  Procedure Laterality Date  . CIRCUMCISION N/A 01/13/2019   Procedure: CIRCUMCISION ADULT;  Surgeon: Marcine Matar, MD;  Location: [facility] ORS;  Service: Urology;  Laterality: N/A;  30 MINS  . CORONARY ANGIOPLASTY  04/2017   [facility]    Family History  Problem Relation Age of Onset  . Diabetes Other   . CAD Other   . Hypertension Other     Social History   Socioeconomic History  . Marital status: Widowed    Spouse name: Not on file  . Number of children: Not on file  . Years of education: Not on file  . Highest education level: Not on file  Occupational History  . Not on file  Tobacco Use  . Smoking status: Former Smoker    Types: Cigarettes    Quit date: 1998    Years since quitting: 23.6  . Smokeless tobacco: Never Used  Vaping Use  . Vaping Use: Never used  Substance and Sexual Activity  . Alcohol use: Never  . Drug use: Never  . Sexual activity: Not on file  Other Topics Concern  . Not on file  Social  History Narrative  . Not on file   Social Determinants of Health   Financial Resource Strain:   . Difficulty of Paying Living Expenses:   Food Insecurity:   . Worried About Programme researcher, broadcasting/film/video in the Last Year:   . Barista in the Last Year:   Transportation Needs:   . Freight forwarder (Medical):   Marland Kitchen Lack of Transportation (Non-Medical):   Physical Activity:   . Days of Exercise per Week:   . Minutes of Exercise per Session:   Stress:   . Feeling of Stress :   Social Connections:   . Frequency of Communication with Friends and Family:   . Frequency of Social Gatherings with Friends and Family:   . Attends Religious Services:   . Active Member of Clubs or Organizations:   . Attends Banker Meetings:   Marland Kitchen Marital Status:   Intimate Partner Violence:   . Fear of Current or Ex-Partner:   . Emotionally Abused:   Marland Kitchen Physically Abused:   . Sexually Abused:      Physical Exam   Vitals:   12/22/19 1313 12/22/19 1330  BP: (!) 141/69 126/63  Pulse: 78 79  Resp: 18 (!) 23  Temp: 98.5 F (36.9 C)   SpO2:  93% 93%    CONSTITUTIONAL: Well-appearing, NAD NEURO:  Alert and oriented x 3, normal and symmetric strength and sensation, normal coordination, normal speech, no visual field cuts, no aphasia, no neglect EYES:  eyes equal and reactive ENT/NECK:  no LAD, no JVD CARDIO: Regular rate, well-perfused, normal S1 and S2 PULM:  CTAB no wheezing or rhonchi GI/GU:  normal bowel sounds, non-distended, non-tender MSK/SPINE:  No gross deformities, no edema SKIN:  no rash, atraumatic PSYCH:  Appropriate speech and behavior  *Additional and/or pertinent findings included in MDM below  Diagnostic and Interventional Summary    EKG Interpretation  Date/Time:  Thursday December 22 2019 13:19:32 EDT Ventricular Rate:  80 PR Interval:    QRS Duration: 146 QT Interval:  408 QTC Calculation: 471 R Axis:   -89 Text Interpretation: Sinus rhythm Right bundle branch  block Inferior infarct, old Confirmed by Kennis Carina [phone]) on 12/22/2019 1:35:35 PM      Labs Reviewed  BASIC METABOLIC PANEL - Abnormal; Notable for the following components:      Result Value   Glucose, Bld 113 (*)    All other components within normal limits  SARS CORONAVIRUS 2 BY RT PCR (HOSPITAL ORDER, PERFORMED IN [facility])  CBC  URINALYSIS, ROUTINE W REFLEX MICROSCOPIC  PROLACTIN  LACTIC ACID, PLASMA  CK    CT HEAD WO CONTRAST  Final Result      Medications  levETIRAcetam (KEPPRA) IVPB 500 mg/100 mL premix (500 mg Intravenous New Bag/Given 12/22/19 1509)  levETIRAcetam (KEPPRA) IVPB 1000 mg/100 mL premix (has no administration in time range)     Procedures  /  Critical Care Procedures  ED Course and Medical Decision Making  I have reviewed the triage vital signs, the nursing notes, and pertinent available records from the EMR.  Listed above are laboratory and imaging tests that I personally ordered, reviewed, and interpreted and then considered in my medical decision making (see below for details).  Recent admission for TIA work-up, with reassuring MRI.  Question of partial seizure by EEG was reassuring as well.  Suspect similar episode, currently at his baseline it seems, will monitor closely and obtain some screening labs and screening CT head     CT head is reassuring.  Case discussed with Dr. Gerilyn Pilgrim, who recommends starting Keppra and, due to the difficulty of obtaining outpatient EEG, recommending admission for EEG and overnight observation.  Accepted for admission by hospitalist service.  Elmer Sow. Pilar Plate, MD [facility] Emergency Medicine [facility] [email or web address]  Final Clinical Impressions(s) / ED Diagnoses     ICD-10-CM   1. Spell of abnormal behavior  R46.89     ED Discharge Orders    None       Discharge Instructions Discussed with and Provided to Patient:   Discharge Instructions   None         Sabas Sous, MD 12/22/19 1517

## 2019-12-23 ENCOUNTER — Observation Stay (HOSPITAL_COMMUNITY)
Admit: 2019-12-23 | Discharge: 2019-12-23 | Disposition: A | Discharge status: Home / Self Care | Payer: Medicare Other | Attending: Family Medicine | Admitting: Family Medicine

## 2019-12-23 DIAGNOSIS — R9401 Abnormal electroencephalogram [EEG]: Secondary | ICD-10-CM

## 2019-12-23 DIAGNOSIS — R4182 Altered mental status, unspecified: Secondary | ICD-10-CM | POA: Diagnosis not present

## 2019-12-23 DIAGNOSIS — R569 Unspecified convulsions: Secondary | ICD-10-CM

## 2019-12-23 DIAGNOSIS — G9341 Metabolic encephalopathy: Secondary | ICD-10-CM

## 2019-12-23 LAB — BASIC METABOLIC PANEL
Anion gap: 10 (ref 5–15)
BUN: 18 mg/dL (ref 8–23)
CO2: 26 mmol/L (ref 22–32)
Calcium: 9 mg/dL (ref 8.9–10.3)
Chloride: 102 mmol/L (ref 98–111)
Creatinine, Ser: 1.09 mg/dL (ref 0.61–1.24)
GFR calc Af Amer: >60 mL/min (ref >60)
GFR calc non Af Amer: >60 mL/min (ref >60)
Glucose, Bld: 92 mg/dL (ref 70–99)
Potassium: 3.9 mmol/L (ref 3.5–5.1)
Sodium: 138 mmol/L (ref 135–145)

## 2019-12-23 LAB — CBC
HCT: 47.1 % (ref 39.0–52.0)
Hemoglobin: 15.3 g/dL (ref 13.0–17.0)
MCH: 29.2 pg (ref 26.0–34.0)
MCHC: 32.5 g/dL (ref 30.0–36.0)
MCV: 89.9 fL (ref 80.0–100.0)
Platelets: 207 10*3/uL (ref 150–400)
RBC: 5.24 MIL/uL (ref 4.22–5.81)
RDW: 13.5 % (ref 11.5–15.5)
WBC: 10.5 10*3/uL (ref 4.0–10.5)
nRBC: 0 % (ref 0.0–0.2)

## 2019-12-23 LAB — GLUCOSE, CAPILLARY
Glucose-Capillary: 90 mg/dL (ref 70–99)
Glucose-Capillary: 122 mg/dL — ABNORMAL HIGH (ref 70–99)

## 2019-12-23 LAB — PROLACTIN: Prolactin: 20.2 ng/mL — ABNORMAL HIGH (ref 4.0–15.2)

## 2019-12-23 MED ORDER — ASPIRIN EC 81 MG PO TBEC: 81.0000 mg | DELAYED_RELEASE_TABLET | Freq: Every day | ORAL | 11 refills | Status: DC

## 2019-12-23 MED ORDER — LEVETIRACETAM 250 MG PO TABS
250.0000 mg | ORAL_TABLET | Freq: Two times a day (BID) | ORAL | 3 refills | Status: DC
Start: 2019-12-23 — End: 2023-12-14

## 2019-12-23 MED ORDER — BLOOD GLUCOSE METER KIT: PACK | 1 refills | Status: DC

## 2019-12-23 NOTE — Procedures (Signed)
Patient Name: Christian Lloyd  MRN: [id number]  Epilepsy Attending: Charlsie Quest  Referring Physician/Provider: Dr Shon Hale Date: 12/23/2019 Duration: 24.40 mins  Patient history: 86yo M with episodes of alteration of awareness.   Level of alertness: Awake, drowsy  AEDs during EEG study: LEV  Technical aspects: This EEG study was done with scalp electrodes positioned according to the 10-20 International system of electrode placement. Electrical activity was acquired at a sampling rate of 500Hz  and reviewed with a high frequency filter of 70Hz  and a low frequency filter of 1Hz . EEG data were recorded continuously and digitally stored.   Description: The posterior dominant rhythm consists of 9-10 Hz activity of moderate voltage (25-35 uV) seen predominantly in posterior head regions, symmetric and reactive to eye opening and eye closing. Drowsiness was characterized by attenuation of the posterior background rhythm.  EEG showed intermittent rhythmic generalized and lateralized left hemisphere 3 to 6 Hz theta-delta slowing. Physiology photic driving was not seen during photic stimulation. Hyperventilation was not performed.     ABNORMALITY -Intermittent rhythmic slow, generalized and lateralized left hemisphere  IMPRESSION: This study is suggestive of non specific cortical dysfunction in left hemisphere as well as mild diffuse encephalopathy, nonspecific etiology. No seizures or epileptiform discharges were seen throughout the recording.  Christian Lloyd 

## 2019-12-23 NOTE — Discharge Instructions (Signed)
1)You are taking Aspirin and Plavix which are blood thinners so Avoid ibuprofen/Advil/Aleve/Motrin/Goody Powders/Naproxen/BC powders/Meloxicam/Diclofenac/Indomethacin and other Nonsteroidal anti-inflammatory medications as these will make you more likely to bleed and can cause stomach ulcers, can also cause Kidney problems.   2)Please follow-up with Neurologist Dr. Beryle Beams-- Phone: [phone], Address: [address], [place], [place] [address] in 4 weeks for recheck and reevaluation.  Please call to make appointment with him  3)Please Take Keppra 250 mg twice a day to prevent seizures  4) please take Metformin 500 mg daily with breakfast for diabetes  5)Per [facility] statutes, patients with seizures are not allowed to drive until they have been seizure-free for six months.   Use caution when using heavy equipment or power tools. Avoid working on ladders or at heights. Take showers instead of baths, Do not lock yourself in a room alone (i.e. bathroom).Ensure the water temperature is not too high on the home water heater. Do not go swimming alone. When caring for infants or small children, sit down when holding, feeding, or changing them to minimize risk of injury to the child in the event you have a seizure.   Do not lock yourself in a room alone (i.e. bathroom).  Maintain good sleep hygiene. Avoid alcohol.   If patienthas another seizure, call 911 and bring them back to the ED if: A. The seizure lasts longer than 5 minutes.  B. The patient doesn't wake shortly after the seizure or has new problems such as difficulty seeing, speaking or moving following the seizure C. The patient was injured during the seizure D. The patient has a temperature over 102 F (39C) E. The patient vomited during the seizure and now is having trouble breathing

## 2019-12-23 NOTE — Plan of Care (Signed)

## 2019-12-23 NOTE — Progress Notes (Signed)
EEG complete - results pending 

## 2019-12-23 NOTE — Progress Notes (Signed)
Nsg Discharge Note  Admit Date:  12/22/2019 Discharge date: 12/23/2019   Srihan Brutus to be D/C'd home per MD order.  AVS completed.  Copy for chart, and copy for patient signed, and dated. Patient/caregiver able to verbalize understanding.  Discharge Medication: Allergies as of 12/23/2019   No Known Allergies     Medication List    TAKE these medications   acetaminophen 325 MG tablet Commonly known as: TYLENOL Take 325 mg by mouth at bedtime as needed for headache (pain).   aspirin EC 81 MG tablet Take 1 tablet (81 mg total) by mouth daily with breakfast. What changed: when to take this   blood glucose meter kit and supplies Dispense based on patient and insurance preference. Use up to four times daily as directed. (FOR ICD-9 250.00, 250.01).   clopidogrel 75 MG tablet Commonly known as: PLAVIX Take 75 mg by mouth daily before breakfast.   FLAX SEED OIL PO Take 1 capsule by mouth daily.   levETIRAcetam 250 MG tablet Commonly known as: Keppra Take 1 tablet (250 mg total) by mouth 2 (two) times daily.   metoprolol tartrate 25 MG tablet Commonly known as: LOPRESSOR Take 25 mg by mouth 2 (two) times daily with a meal.   rosuvastatin 20 MG tablet Commonly known as: CRESTOR Take 20 mg by mouth daily with supper.   TUMS PO Take 1 tablet by mouth 2 (two) times daily as needed (heartburn).       Discharge Assessment: Vitals:   12/22/19 2039 12/23/19 0041  BP: 131/73 108/60  Pulse: 74 79  Resp: 16 16  Temp: 99 F (37.2 C) 98.2 F (36.8 C)  SpO2: 92% 96%   Skin clean, dry and intact without evidence of skin break down, no evidence of skin tears noted. IV catheter discontinued intact. Site without signs and symptoms of complications - no redness or edema noted at insertion site, patient denies c/o pain - only slight tenderness at site.  Dressing with slight pressure applied.  D/c Instructions-Education: Discharge instructions given to patient/family with  verbalized understanding. D/c education completed with patient/family including follow up instructions, medication list, d/c activities limitations if indicated, with other d/c instructions as indicated by MD - patient able to verbalize understanding, all questions fully answered. Patient instructed to return to ED, call 911, or call MD for any changes in condition.  Patient escorted via [place], and D/C home via private auto.  Zachery Conch, RN 12/23/2019 2:21 PM

## 2019-12-23 NOTE — Discharge Summary (Signed)
Christian Lloyd, is a 86 y.o. male  DOB [birth year]  MRN [id number].  Admission date:  12/22/2019  Admitting Physician  Madeliene Tejera Denton Brick, MD  Discharge Date:  12/23/2019   Primary MD  Aletha Halim., PA-C  Recommendations for primary care physician for things to follow:   1)You are taking Aspirin and Plavix which are blood thinners so Avoid ibuprofen/Advil/Aleve/Motrin/Goody Powders/Naproxen/BC powders/Meloxicam/Diclofenac/Indomethacin and other Nonsteroidal anti-inflammatory medications as these will make you more likely to bleed and can cause stomach ulcers, can also cause Kidney problems.   2)Please follow-up with Neurologist Dr. Phillips Odor-- Phone: [phone], Address: [address], [place],  [address] in 4 weeks for recheck and reevaluation.  Please call to make appointment with him  3)Please Take Keppra 250 mg twice a day to prevent seizures  4) please take Metformin 500 mg daily with breakfast for diabetes  5)Per [facility] statutes, patients with seizures are not allowed to drive until they have been seizure-free for six months.   Use caution when using heavy equipment or power tools. Avoid working on ladders or at heights. Take showers instead of baths, Do not lock yourself in a room alone (i.e. bathroom).Ensure the water temperature is not too high on the home water heater. Do not go swimming alone. When caring for infants or small children, sit down when holding, feeding, or changing them to minimize risk of injury to the child in the event you have a seizure.   Do not lock yourself in a room alone (i.e. bathroom).  Maintain good sleep hygiene. Avoid alcohol.   If patienthas another seizure, call 911 and bring them back to the ED if: A. The seizure lasts longer than 5 minutes.  B. The patient doesn't wake shortly after the seizure or has new problems such as  difficulty seeing, speaking or moving following the seizure C. The patient was injured during the seizure D. The patient has a temperature over 102 F (39C) E. The patient vomited during the seizure and now is having trouble breathing  Admission Diagnosis  Spell of abnormal behavior [O67.12] Acute metabolic encephalopathy [W58.09]   Discharge Diagnosis  Spell of abnormal behavior [X83.38] Acute metabolic encephalopathy [S50.53]    Principal Problem:   Acute metabolic encephalopathy Active Problems:   Seizure --- complex partial seizures   DM (diabetes mellitus), type 2 (HCC)   CAD (coronary artery disease)   Essential hypertension   AF (paroxysmal atrial fibrillation) (HCC)      Past Medical History:  Diagnosis Date  . Dysrhythmia   . Myocardial infarction [facility]) 04/2017    Past Surgical History:  Procedure Laterality Date  . CIRCUMCISION N/A 01/13/2019   Procedure: CIRCUMCISION ADULT;  Surgeon: Franchot Gallo, MD;  Location: [facility];  Service: Urology;  Laterality: N/A;  30 MINS  . CORONARY ANGIOPLASTY  04/2017   [facility]     HPI  from the history and physical done on the day of admission:    Christian Lloyd  is a 86 y.o.  malewith past medical history significant for coronary artery disease status CABG in 2018, NSTEMI, atrial fibrillation and prediabetes presents with episodes of alteration of mentation -Patient had a similar episode on 12/17/2019 he was admitted to [facility] stroke work-up including extensive neuroimaging studies echo and EEG were unremarkable -Neurology allow time thought patient may be having complex partial seizures she was discharged home on 12/18/2019 -Returns today with similar episode - Patient appears back to baseline at this time according to son-in-law who is at bedside -No chest pains, no palpitations no dizziness no incontinence no tongue biting --Prolactin pending CK is not elevated -Lactic acid is not elevated -Blood glucose  is 113    Hospital Course:    1)Complex Partial Seizures----discussed with neurologist Dr. Merlene Laughter okay to treat empirically with IV Keppra load -Repeat EEG study noted  -Patient recently had extensive stroke/neuro imaging work-up from 12/17/2019 through 12/18/2019 --Prolactin is 20.2  CK is not elevated (78) -Lactic acid is not elevated (1.5) -Blood glucose was 113 on admission  -No chest pains, no palpitations no dizziness no incontinence no tongue biting -Discharge on Keppra 250 twice daily  2)Coronary artery disease status CABG in 2018--- status post previous NSTEMI--no ACS type symptoms at this time, continue aspirin and Plavix as well as metoprolol and Crestor  3)PAFib--- not on full anticoagulation apparently due to the need to be on DAPT -Continue metoprolol for rate control  4)Diabetes Mellitus-- A1c was 7.2 on 12/17/2019  -Okay to start Metformin at 500 mg every morning    Disposition---home  Dispo: The patient is from: Home  Anticipated d/c is to: Home  Discharge Condition: stable-  Follow UP   Follow-up Information    Phillips Odor, MD. Schedule an appointment as soon as possible for a visit in 4 week(s).   Specialty: Neurology Why: Follow-up on complex partial seizures Contact information: [address] [place] [address] [phone]                Consults obtained - Discussed with Dr. Merlene Laughter the neurologist  Diet and Activity recommendation:  As advised  Discharge Instructions    Discharge Instructions    Call MD for:  difficulty breathing, headache or visual disturbances   Complete by: As directed    Call MD for:  persistant dizziness or light-headedness   Complete by: As directed    Call MD for:  persistant nausea and vomiting   Complete by: As directed    Call MD for:  temperature >100.4   Complete by: As directed    Diet - low sodium heart healthy   Complete by: As directed    Discharge  instructions   Complete by: As directed    1)You are taking Aspirin and Plavix which are blood thinners so Avoid ibuprofen/Advil/Aleve/Motrin/Goody Powders/Naproxen/BC powders/Meloxicam/Diclofenac/Indomethacin and other Nonsteroidal anti-inflammatory medications as these will make you more likely to bleed and can cause stomach ulcers, can also cause Kidney problems.   2)Please follow-up with Neurologist Dr. Phillips Odor-- Phone: [phone], Address: [address], [place],  [address] in 4 weeks for recheck and reevaluation.  Please call to make appointment with him  3)Please Take Keppra 250 mg twice a day to prevent seizures  4) please take Metformin 500 mg daily with breakfast for diabetes  5)Per [facility] statutes, patients with seizures are not allowed to drive until they have been seizure-free for six months.   Use caution when using heavy equipment or power tools. Avoid working  on ladders or at heights. Take showers instead of baths, Do not lock yourself in a room alone (i.e. bathroom).Ensure the water temperature is not too high on the home water heater. Do not go swimming alone. When caring for infants or small children, sit down when holding, feeding, or changing them to minimize risk of injury to the child in the event you have a seizure.   Do not lock yourself in a room alone (i.e. bathroom).  Maintain good sleep hygiene. Avoid alcohol.   If patienthas another seizure, call 911 and bring them back to the ED if: A. The seizure lasts longer than 5 minutes.  B. The patient doesn't wake shortly after the seizure or has new problems such as difficulty seeing, speaking or moving following the seizure C. The patient was injured during the seizure D. The patient has a temperature over 102 F (39C) E. The patient vomited during the seizure and now is having trouble breathing   Increase activity slowly   Complete by: As directed         Discharge  Medications     Allergies as of 12/23/2019   No Known Allergies     Medication List    TAKE these medications   acetaminophen 325 MG tablet Commonly known as: TYLENOL Take 325 mg by mouth at bedtime as needed for headache (pain).   aspirin EC 81 MG tablet Take 1 tablet (81 mg total) by mouth daily with breakfast. What changed: when to take this   blood glucose meter kit and supplies Dispense based on patient and insurance preference. Use up to four times daily as directed. (FOR ICD-9 250.00, 250.01).   clopidogrel 75 MG tablet Commonly known as: PLAVIX Take 75 mg by mouth daily before breakfast.   FLAX SEED OIL PO Take 1 capsule by mouth daily.   levETIRAcetam 250 MG tablet Commonly known as: Keppra Take 1 tablet (250 mg total) by mouth 2 (two) times daily.   metoprolol tartrate 25 MG tablet Commonly known as: LOPRESSOR Take 25 mg by mouth 2 (two) times daily with a meal.   rosuvastatin 20 MG tablet Commonly known as: CRESTOR Take 20 mg by mouth daily with supper.   TUMS PO Take 1 tablet by mouth 2 (two) times daily as needed (heartburn).       Major procedures and Radiology Reports - PLEASE review detailed and final reports for all details, in brief -   EEG  Result Date: 12/18/2019 Greta Doom, MD     12/18/2019 10:40 AM History: 86 yo M being evaluated for a traisnet alteration of awareness. Sedation: None Technique: This is a 21 channel routine scalp EEG performed at the bedside with bipolar and monopolar montages arranged in accordance to the international 10/20 system of electrode placement. One channel was dedicated to EKG recording. Background: The background consists of intermixed alpha and beta activities. There is a well defined posterior dominant rhythm of 9 Hz that attenuates with eye opening.  There is an increase in irregular delta associated with drowsiness, and sleep is briefly seen and appears normal. Photic stimulation: Physiologic driving is  not performed EEG Abnormalities: None Clinical Interpretation: This normal EEG is recorded in the waking and sleep state. There was no seizure or seizure predisposition recorded on this study. Please note that lack of epileptiform activity on EEG does not preclude the possibility of epilepsy. Roland Rack, MD [facility] [phone] If 7pm- 7am, please page neurology on call as listed in [place].  CT HEAD WO CONTRAST  Result Date: 12/22/2019 CLINICAL DATA:  Transient delirium.  Altered mental status. EXAM: CT HEAD WITHOUT CONTRAST TECHNIQUE: Contiguous axial images were obtained from the base of the skull through the vertex without intravenous contrast. COMPARISON:  MRI 12/18/2019 FINDINGS: Brain: Age related atrophy. Chronic small-vessel ischemic changes of the cerebral hemispheric white matter. No sign of acute infarction, mass lesion, hemorrhage, hydrocephalus or extra-axial collection. Patient has a tendency towards dural calcification. Vascular: There is atherosclerotic calcification of the major vessels at the base of the brain. Skull: Negative Sinuses/Orbits: Clear/normal Other: None IMPRESSION: No acute finding by CT. Age related atrophy and chronic small-vessel ischemic changes of the white matter. Electronically Signed   By: Nelson Chimes M.D.   On: 12/22/2019 14:13   MR BRAIN WO CONTRAST  Result Date: 12/18/2019 CLINICAL DATA:  Speech changes.  Possible stroke. EXAM: MRI HEAD WITHOUT CONTRAST TECHNIQUE: Multiplanar, multiecho pulse sequences of the brain and surrounding structures were obtained without intravenous contrast. COMPARISON:  None. FINDINGS: BRAIN: No acute infarct, acute hemorrhage or extra-axial collection. Multifocal white matter hyperintensity, most commonly due to chronic ischemic microangiopathy. There is generalized atrophy without lobar predilection. No chronic microhemorrhage. Normal midline structures. VASCULAR: Major flow voids are preserved. SKULL AND UPPER  CERVICAL SPINE: Normal calvarium and skull base. Visualized upper cervical spine and soft tissues are normal. SINUSES/ORBITS: No paranasal sinus fluid levels or advanced mucosal thickening. No mastoid or middle ear effusion. Normal orbits. IMPRESSION: 1. No acute intracranial abnormality. 2. Generalized atrophy without lobar predilection. Mild chronic small vessel disease. Electronically Signed   By: Ulyses Jarred M.D.   On: 12/18/2019 01:03   DG CHEST PORT 1 VIEW  Result Date: 12/17/2019 CLINICAL DATA:  Stroke. EXAM: PORTABLE CHEST 1 VIEW COMPARISON:  None. FINDINGS: Postoperative changes in the mediastinum. Borderline heart size with normal pulmonary vascularity. No consolidation or edema in the lungs. No pleural effusions. No pneumothorax. Possible mild atelectasis in the left base. Mediastinal contours appear intact. Calcification of the aorta. IMPRESSION: Possible mild atelectasis in the left base. Electronically Signed   By: Lucienne Capers M.D.   On: 12/17/2019 23:48   EEG adult  Result Date: 12/23/2019 Lora Havens, MD     12/23/2019 12:59 PM Patient Name: Christian Lloyd MRN: [id number] Epilepsy Attending: Lora Havens Referring Physician/Provider: Dr Roxan Hockey Date: 12/23/2019 Duration: 24.40 mins Patient history: 86yo M with episodes of alteration of awareness. Level of alertness: Awake, drowsy AEDs during EEG study: LEV Technical aspects: This EEG study was done with scalp electrodes positioned according to the 10-20 International system of electrode placement. Electrical activity was acquired at a sampling rate of _0  and reviewed with a high frequency filter of _1  and a low frequency filter of _2 . EEG data were recorded continuously and digitally stored. Description: The posterior dominant rhythm consists of 9-10 Hz activity of moderate voltage (25-35 uV) seen predominantly in posterior head regions, symmetric and reactive to eye opening and eye closing. Drowsiness was  characterized by attenuation of the posterior background rhythm.  EEG showed intermittent rhythmic generalized and lateralized left hemisphere 3 to 6 Hz theta-delta slowing. Physiology photic driving was not seen during photic stimulation. Hyperventilation was not performed.   ABNORMALITY -Intermittent rhythmic slow, generalized and lateralized left hemisphere IMPRESSION: This study is suggestive of non specific cortical dysfunction in left hemisphere as well as mild diffuse encephalopathy, nonspecific etiology. No seizures or epileptiform discharges were seen throughout the recording. Priyanka Barbra Sarks   ECHOCARDIOGRAM COMPLETE  Result Date: 12/18/2019    ECHOCARDIOGRAM REPORT   Patient Name:   Christian Lloyd Date of Exam: 12/18/2019 Medical Rec #:  [id number]     Height:       64.0 in Accession #:    [id number]    Weight:       162.0 lb Date of Birth:  [birth year]     BSA:          1.789 m Patient Age:    37 years      BP:           147/58 mmHg Patient Gender: M             HR:           71 bpm. Exam Location:  Inpatient Procedure: 2D Echo, Cardiac Doppler and Color Doppler Indications:    TIA 435.9 / G45.9  History:        Patient has no prior history of Echocardiogram examinations. CAD                 and Previous Myocardial Infarction, TIA; Risk                 Factors:Hypertension, Diabetes and Dyslipidemia.  Sonographer:    Alvino Chapel RCS Referring Phys: [id number] [place]  1. Left ventricular ejection fraction, by estimation, is 45 to 50%. The left ventricle has mildly decreased function. The left ventricle demonstrates regional wall motion abnormalities (see scoring diagram/findings for description). Left ventricular diastolic parameters are consistent with Grade I diastolic dysfunction (impaired relaxation).  2. Right ventricular systolic function is moderately reduced. The right ventricular size is normal. Tricuspid regurgitation signal is inadequate for assessing PA pressure.  3. The  mitral valve is normal in structure. Trivial mitral valve regurgitation. No evidence of mitral stenosis.  4. The aortic valve is tricuspid. Aortic valve regurgitation is mild. No aortic stenosis is present.  5. The inferior vena cava is normal in size with <50% respiratory variability, suggesting right atrial pressure of 8 mmHg. Conclusion(s)/Recommendation(s): No intracardiac source of embolism detected on this transthoracic study. A transesophageal echocardiogram is recommended to exclude cardiac source of embolism if clinically indicated. FINDINGS  Left Ventricle: Left ventricular ejection fraction, by estimation, is 45 to 50%. The left ventricle has mildly decreased function. The left ventricle demonstrates regional wall motion abnormalities. Definity contrast agent was given IV to delineate the left ventricular endocardial borders. The left ventricular internal cavity size was normal in size. There is no left ventricular hypertrophy. Left ventricular diastolic parameters are consistent with Grade I diastolic dysfunction (impaired relaxation).  LV Wall Scoring: The mid inferior segment is akinetic. The anterior wall, antero-lateral wall, posterior wall, and basal inferior segment are hypokinetic. Right Ventricle: The right ventricular size is normal. No increase in right ventricular wall thickness. Right ventricular systolic function is moderately reduced. Tricuspid regurgitation signal is inadequate for assessing PA pressure. Left Atrium: Left atrial size was normal in size. Right Atrium: Right atrial size was normal in size. Pericardium: There is no evidence of pericardial effusion. Mitral Valve: The mitral valve is normal in structure. Normal mobility of the mitral valve leaflets. Trivial mitral valve regurgitation. No evidence of mitral valve stenosis. Tricuspid Valve: The tricuspid valve is normal in structure. Tricuspid valve regurgitation is trivial. No evidence of tricuspid stenosis. Aortic Valve: The  aortic valve is tricuspid. Aortic valve regurgitation is mild. Aortic regurgitation PHT measures 512 msec. No aortic stenosis is present. Pulmonic Valve: The pulmonic valve  was normal in structure. Pulmonic valve regurgitation is trivial. No evidence of pulmonic stenosis. Aorta: The aortic root is normal in size and structure. Venous: The inferior vena cava is normal in size with less than 50% respiratory variability, suggesting right atrial pressure of 8 mmHg. IAS/Shunts: No atrial level shunt detected by color flow Doppler.  LEFT VENTRICLE PLAX 2D LVIDd:         4.20 cm  Diastology LVIDs:         3.30 cm  LV e' lateral:   8.16 cm/s LV PW:         1.40 cm  LV E/e' lateral: 6.2 LV IVS:        1.50 cm  LV e' medial:    4.13 cm/s LVOT diam:     1.80 cm  LV E/e' medial:  12.2 LV SV:         40 LV SV Index:   22 LVOT Area:     2.54 cm  RIGHT VENTRICLE RV S prime:     6.31 cm/s TAPSE (M-mode): 1.5 cm LEFT ATRIUM             Index       RIGHT ATRIUM          Index LA diam:        2.70 cm 1.51 cm/m  RA Area:     8.91 cm LA Vol (A2C):   28.1 ml 15.71 ml/m RA Volume:   14.60 ml 8.16 ml/m LA Vol (A4C):   33.4 ml 18.67 ml/m LA Biplane Vol: 30.8 ml 17.22 ml/m  AORTIC VALVE LVOT Vmax:   79.90 cm/s LVOT Vmean:  50.200 cm/s LVOT VTI:    0.158 m AI PHT:      512 msec  AORTA Ao Root diam: 3.50 cm MITRAL VALVE MV Area (PHT): 4.65 cm    SHUNTS MV Decel Time: 163 msec    Systemic VTI:  0.16 m MV E velocity: 50.20 cm/s  Systemic Diam: 1.80 cm MV A velocity: 77.10 cm/s MV E/A ratio:  0.65 Cherlynn Kaiser MD Electronically signed by Cherlynn Kaiser MD Signature Date/Time: 12/18/2019/2:56:46 PM    Final    CT HEAD CODE STROKE WO CONTRAST  Result Date: 12/17/2019 CLINICAL DATA:  Code stroke.  Acute neuro deficit.  Aphasia EXAM: CT HEAD WITHOUT CONTRAST TECHNIQUE: Contiguous axial images were obtained from the base of the skull through the vertex without intravenous contrast. COMPARISON:  None. FINDINGS: Brain: Motion degraded  study. Generalized atrophy. Mild ventricular enlargement consistent with atrophy. Patchy white matter hypodensity bilaterally appears chronic. Negative for acute infarct, hemorrhage, mass. Prominent calcification of the falx and tentorium bilaterally. Vascular: Negative for hyperdense vessel Skull: Negative Sinuses/Orbits: Mild mucosal edema paranasal sinuses. Bilateral cataract extraction. Other: None ASPECTS ([place] Stroke Program Early CT Score) - Ganglionic level infarction (caudate, lentiform nuclei, internal capsule, insula, M1-M3 cortex): 7 - Supraganglionic infarction (M4-M6 cortex): 3 Total score (0-10 with 10 being normal): 10 IMPRESSION: 1. No acute abnormality 2. ASPECTS is 10 3. Atrophy and chronic microvascular ischemic change in the white matter. Motion degraded study. 4. Preliminary report texted to Dr. Lorraine Lax Electronically Signed   By: Franchot Gallo M.D.   On: 12/17/2019 20:31   CT ANGIO HEAD CODE STROKE  Result Date: 12/17/2019 CLINICAL DATA:  Focal neuro deficit suspect stroke.  Aphasia. EXAM: CT ANGIOGRAPHY HEAD AND NECK TECHNIQUE: Multidetector CT imaging of the head and neck was performed using the standard protocol during bolus administration of intravenous contrast.  Multiplanar CT image reconstructions and MIPs were obtained to evaluate the vascular anatomy. Carotid stenosis measurements (when applicable) are obtained utilizing NASCET criteria, using the distal internal carotid diameter as the denominator. CONTRAST:  31m OMNIPAQUE IOHEXOL 350 MG/ML SOLN COMPARISON:  CT head 12/17/2019 FINDINGS: CTA NECK FINDINGS Aortic arch: Standard branching. Imaged portion shows no evidence of aneurysm or dissection. No significant stenosis of the major arch vessel origins. Atherosclerotic aortic arch. Right carotid system: Atherosclerotic calcification right carotid bifurcation. Right internal carotid artery narrowed by approximately 25% diameter stenosis. Left carotid system: Atherosclerotic  disease left carotid bifurcation extending into the proximal left internal carotid artery. Left internal carotid artery narrowed by approximately 50% diameter stenosis. Vertebral arteries: Left vertebral artery dominant. Both vertebral arteries patent to the basilar without significant stenosis. Skeleton: Cervical spondylosis. Median sternotomy. No acute skeletal abnormality. Other neck: Negative for mass or adenopathy in the neck. Upper chest: Apical emphysema without acute abnormality. Review of the MIP images confirms the above findings CTA HEAD FINDINGS Anterior circulation: Atherosclerotic calcification in the cavernous carotid bilaterally. Mild stenosis on the right. No significant stenosis on the left. Anterior and middle cerebral arteries patent bilaterally without large vessel occlusion or flow limiting stenosis. There is mild atherosclerotic disease in the M1 segment bilaterally. Posterior circulation: Mild atherosclerotic disease in both vertebral arteries at the skull base. PICA origin not visualized bilaterally. There is AICA bilaterally which is patent which supplies the PICA territory. Mild atherosclerotic disease in the basilar without significant stenosis. Superior cerebellar artery patent bilaterally. Right posterior cerebral artery patent proximally. There is diffuse disease in the P2 and P3 segment which is most severe in the P3 segment. Proximal left posterior cerebral artery patent. There is a moderate stenosis proximal left P2 segment and a severe stenosis left P3 segment Venous sinuses: Normal venous enhancement Anatomic variants: None Review of the MIP images confirms the above findings IMPRESSION: 1. Moderate stenosis left P2 segment and severe stenosis left P3 segment. 2. Severe stenosis right P3 segment. 3. Negative for intracranial large vessel occlusion. 4. Mild atherosclerotic narrowing of the M1 segment bilaterally. 5. 25% diameter stenosis proximal right internal carotid artery. 6.  50% diameter stenosis proximal left internal carotid artery 7. Both vertebral arteries widely patent in the neck. Electronically Signed   By: CFranchot GalloM.D.   On: 12/17/2019 21:03   CT ANGIO NECK CODE STROKE  Result Date: 12/17/2019 CLINICAL DATA:  Focal neuro deficit suspect stroke.  Aphasia. EXAM: CT ANGIOGRAPHY HEAD AND NECK TECHNIQUE: Multidetector CT imaging of the head and neck was performed using the standard protocol during bolus administration of intravenous contrast. Multiplanar CT image reconstructions and MIPs were obtained to evaluate the vascular anatomy. Carotid stenosis measurements (when applicable) are obtained utilizing NASCET criteria, using the distal internal carotid diameter as the denominator. CONTRAST:  755mOMNIPAQUE IOHEXOL 350 MG/ML SOLN COMPARISON:  CT head 12/17/2019 FINDINGS: CTA NECK FINDINGS Aortic arch: Standard branching. Imaged portion shows no evidence of aneurysm or dissection. No significant stenosis of the major arch vessel origins. Atherosclerotic aortic arch. Right carotid system: Atherosclerotic calcification right carotid bifurcation. Right internal carotid artery narrowed by approximately 25% diameter stenosis. Left carotid system: Atherosclerotic disease left carotid bifurcation extending into the proximal left internal carotid artery. Left internal carotid artery narrowed by approximately 50% diameter stenosis. Vertebral arteries: Left vertebral artery dominant. Both vertebral arteries patent to the basilar without significant stenosis. Skeleton: Cervical spondylosis. Median sternotomy. No acute skeletal abnormality. Other neck: Negative for mass or adenopathy  in the neck. Upper chest: Apical emphysema without acute abnormality. Review of the MIP images confirms the above findings CTA HEAD FINDINGS Anterior circulation: Atherosclerotic calcification in the cavernous carotid bilaterally. Mild stenosis on the right. No significant stenosis on the left. Anterior  and middle cerebral arteries patent bilaterally without large vessel occlusion or flow limiting stenosis. There is mild atherosclerotic disease in the M1 segment bilaterally. Posterior circulation: Mild atherosclerotic disease in both vertebral arteries at the skull base. PICA origin not visualized bilaterally. There is AICA bilaterally which is patent which supplies the PICA territory. Mild atherosclerotic disease in the basilar without significant stenosis. Superior cerebellar artery patent bilaterally. Right posterior cerebral artery patent proximally. There is diffuse disease in the P2 and P3 segment which is most severe in the P3 segment. Proximal left posterior cerebral artery patent. There is a moderate stenosis proximal left P2 segment and a severe stenosis left P3 segment Venous sinuses: Normal venous enhancement Anatomic variants: None Review of the MIP images confirms the above findings IMPRESSION: 1. Moderate stenosis left P2 segment and severe stenosis left P3 segment. 2. Severe stenosis right P3 segment. 3. Negative for intracranial large vessel occlusion. 4. Mild atherosclerotic narrowing of the M1 segment bilaterally. 5. 25% diameter stenosis proximal right internal carotid artery. 6. 50% diameter stenosis proximal left internal carotid artery 7. Both vertebral arteries widely patent in the neck. Electronically Signed   By: Franchot Gallo M.D.   On: 12/17/2019 21:03    Micro Results   Recent Results (from the past 240 hour(s))  SARS Coronavirus 2 by RT PCR (hospital order, performed in [facility] hospital lab) Nasopharyngeal Nasopharyngeal Swab     Status: None   Collection Time: 12/17/19 11:08 PM   Specimen: Nasopharyngeal Swab  Result Value Ref Range Status   SARS Coronavirus 2 NEGATIVE NEGATIVE Final    Comment: (NOTE) SARS-CoV-2 target nucleic acids are NOT DETECTED.  The SARS-CoV-2 RNA is generally detectable in upper and lower respiratory specimens during the acute phase of  infection. The lowest concentration of SARS-CoV-2 viral copies this assay can detect is 250 copies / mL. A negative result does not preclude SARS-CoV-2 infection and should not be used as the sole basis for treatment or other patient management decisions.  A negative result may occur with improper specimen collection / handling, submission of specimen other than nasopharyngeal swab, presence of viral mutation(s) within the areas targeted by this assay, and inadequate number of viral copies (<250 copies / mL). A negative result must be combined with clinical observations, patient history, and epidemiological information.  Fact Sheet for Patients:   StrictlyIdeas.no  Fact Sheet for Healthcare Providers: BankingDealers.co.za  This test is not yet approved or  cleared by the [place] FDA and has been authorized for detection and/or diagnosis of SARS-CoV-2 by FDA under an Emergency Use Authorization (EUA).  This EUA will remain in effect (meaning this test can be used) for the duration of the COVID-19 declaration under Section 564(b)(1) of the Act, 21 U.S.C. section 360bbb-3(b)(1), unless the authorization is terminated or revoked sooner.  Performed at [facility], [place] [address] [place], [place] [address]   SARS Coronavirus 2 by RT PCR (hospital order, performed in [facility] hospital lab) Nasopharyngeal Nasopharyngeal Swab     Status: None   Collection Time: 12/22/19  3:24 PM   Specimen: Nasopharyngeal Swab  Result Value Ref Range Status   SARS Coronavirus 2 NEGATIVE NEGATIVE Final    Comment: (NOTE) SARS-CoV-2 target  nucleic acids are NOT DETECTED.  The SARS-CoV-2 RNA is generally detectable in upper and lower respiratory specimens during the acute phase of infection. The lowest concentration of SARS-CoV-2 viral copies this assay can detect is 250 copies / mL. A negative result does not preclude SARS-CoV-2 infection and  should not be used as the sole basis for treatment or other patient management decisions.  A negative result may occur with improper specimen collection / handling, submission of specimen other than nasopharyngeal swab, presence of viral mutation(s) within the areas targeted by this assay, and inadequate number of viral copies (<250 copies / mL). A negative result must be combined with clinical observations, patient history, and epidemiological information.  Fact Sheet for Patients:   StrictlyIdeas.no  Fact Sheet for Healthcare Providers: BankingDealers.co.za  This test is not yet approved or  cleared by the [place] FDA and has been authorized for detection and/or diagnosis of SARS-CoV-2 by FDA under an Emergency Use Authorization (EUA).  This EUA will remain in effect (meaning this test can be used) for the duration of the COVID-19 declaration under Section 564(b)(1) of the Act, 21 U.S.C. section 360bbb-3(b)(1), unless the authorization is terminated or revoked sooner.  Performed at [facility], [address], [place], [place] [address]    Today   Subjective    Christian Lloyd today has no new complaints  -Male relative at bedside -Questions answered          Patient has been seen and examined prior to discharge   Objective   Blood pressure 108/60, pulse 79, temperature 98.2 F (36.8 C), resp. rate 16, height _0  (1.651 m), weight 63.5 kg, SpO2 96 %.   Intake/Output Summary (Last 24 hours) at 12/23/2019 1302 Last data filed at 12/23/2019 0858 Gross per 24 hour  Intake 288.22 ml  Output --  Net 288.22 ml   Exam Gen:- Awake Alert, no acute distress  HEENT:- [place].AT, No sclera icterus Neck-Supple Neck,No JVD,.  Lungs-  CTAB , good air movement bilaterally  CV- S1, S2 normal, regular Abd-  +ve B.Sounds, Abd Soft, No tenderness,    Extremity/Skin:- No  edema,   good pulses Psych-affect is appropriate, oriented  x3 Neuro-no new focal deficits, no tremors    Data Review   CBC w Diff:  Lab Results  Component Value Date   WBC 10.5 12/23/2019   HGB 15.3 12/23/2019   HCT 47.1 12/23/2019   PLT 207 12/23/2019   LYMPHOPCT 42 12/17/2019   MONOPCT 10 12/17/2019   EOSPCT 3 12/17/2019   BASOPCT 1 12/17/2019    CMP:  Lab Results  Component Value Date   NA 138 12/23/2019   K 3.9 12/23/2019   CL 102 12/23/2019   CO2 26 12/23/2019   BUN 18 12/23/2019   CREATININE 1.09 12/23/2019   PROT 6.6 12/17/2019   ALBUMIN 3.9 12/17/2019   BILITOT 0.8 12/17/2019   ALKPHOS 47 12/17/2019   AST 17 12/17/2019   ALT 15 12/17/2019  .  Total Discharge time is about 33 minutes  Roxan Hockey M.D on 12/23/2019 at 1:02 PM  Go to [email or web address] -  for contact info  [facility] - Office  [phone]

## 2021-01-16 IMAGING — CT CT HEAD W/O CM
4 series · 17 of 47 positions shown, 19 images · non-contrast
Comparison: MRI 12/18/2019

CLINICAL DATA: Transient delirium.  Altered mental status.

EXAM:
CT HEAD WITHOUT CONTRAST
TECHNIQUE: Contiguous axial images were obtained from the base of the skull
through the vertex without intravenous contrast.

[Series 2: head w o · axial · 0.42mm/px · z∈[+74,+184]mm · 7 of 30 slices shown, 9 images]
[im 4/30  brain]
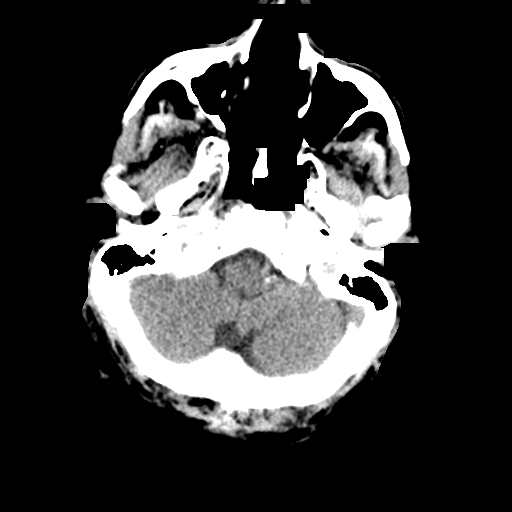
[im 4/30  bone]
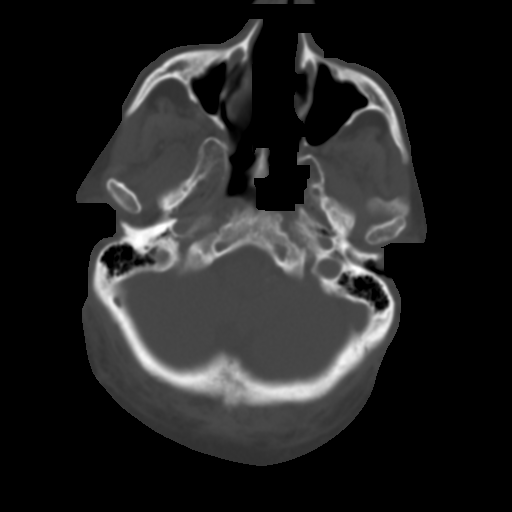
[im 8/30  brain]
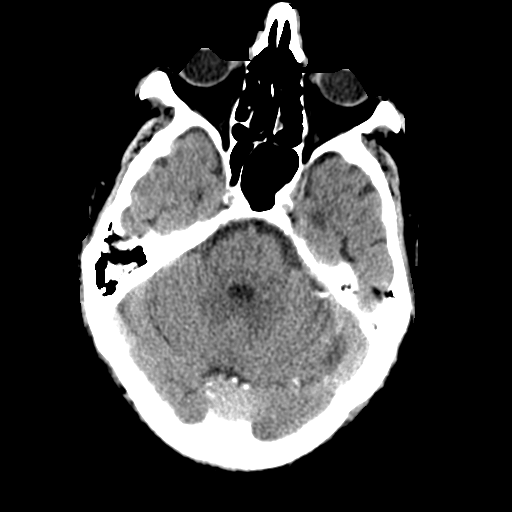
[im 11/30  brain]
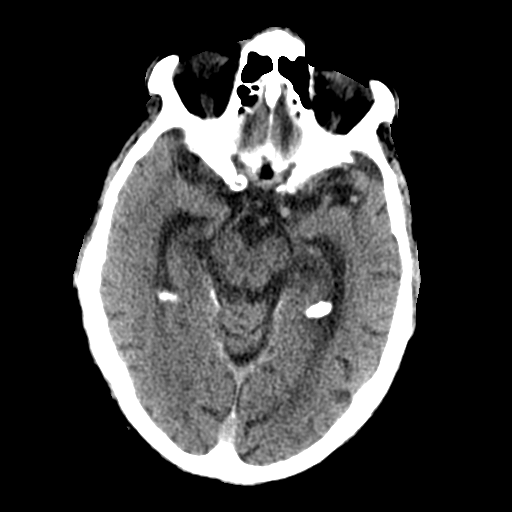
[im 15/30  brain]
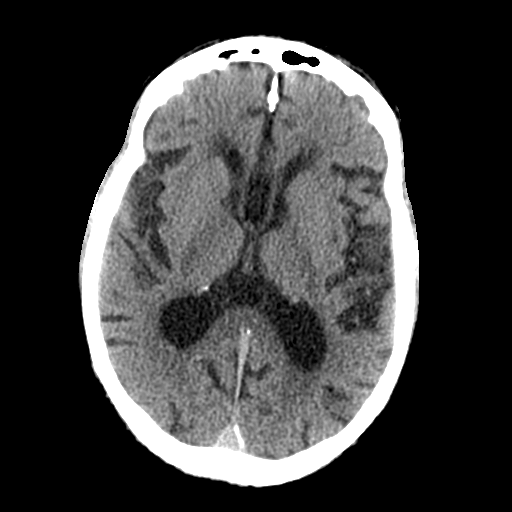
[im 19/30  brain]
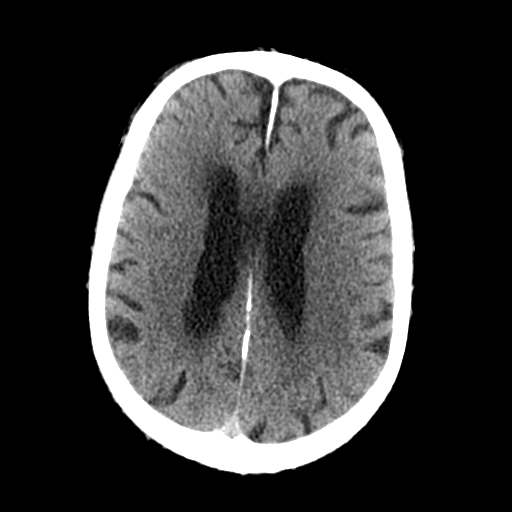
[im 19/30  bone]
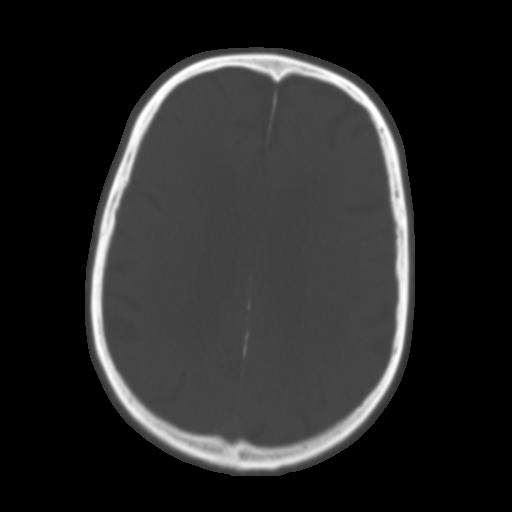
[im 22/30  brain]
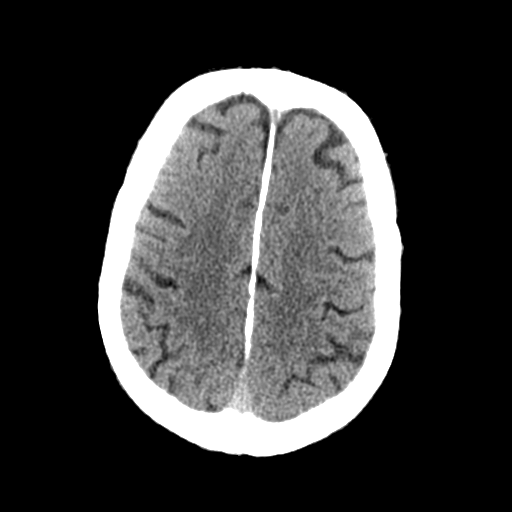
[im 26/30  brain]
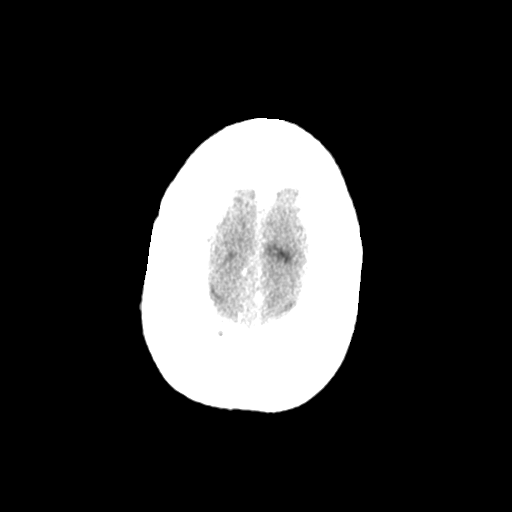

[Series 3: head bone · axial · 0.42mm/px · z∈[+73,+125]mm · 4 of 75 slices shown]
[im 8/75  bone]
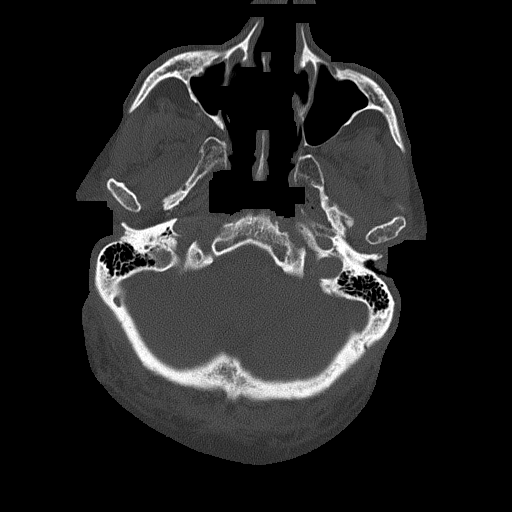
[im 15/75  bone]
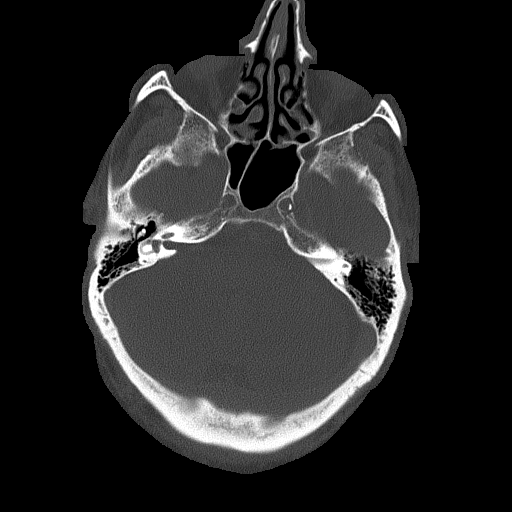
[im 23/75  bone]
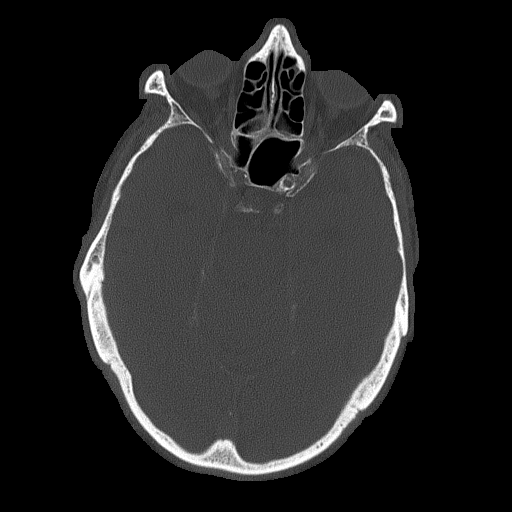
[im 34/75  bone]
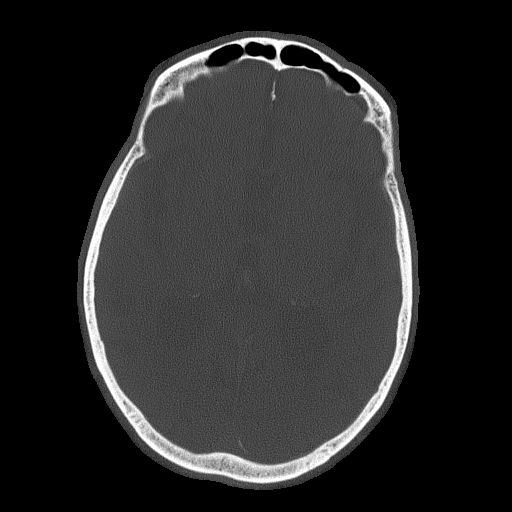

[Series 4: coronal soft · coronal · 0.36mm/px · 3 of 83 slices shown]
[im 28/83  brain]
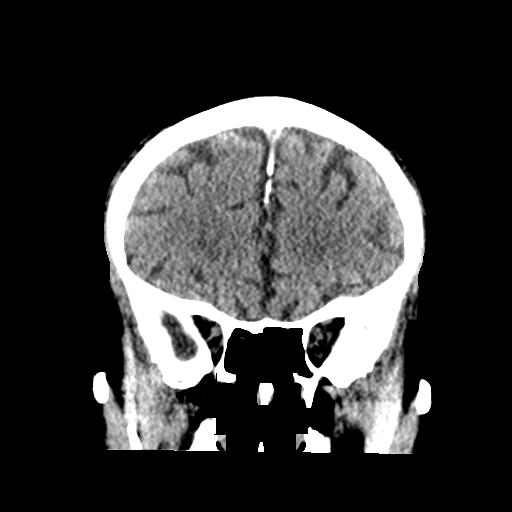
[im 37/83  brain]
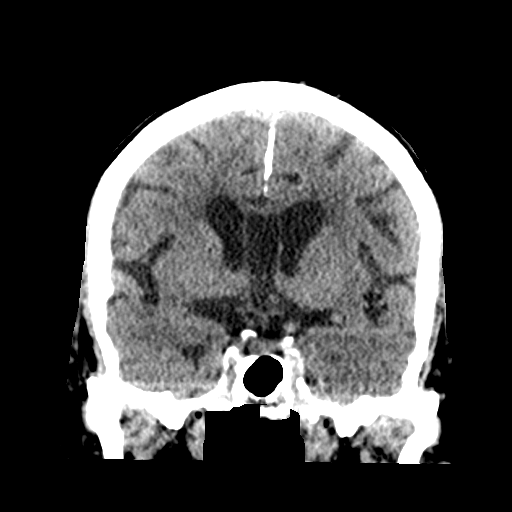
[im 46/83  brain]
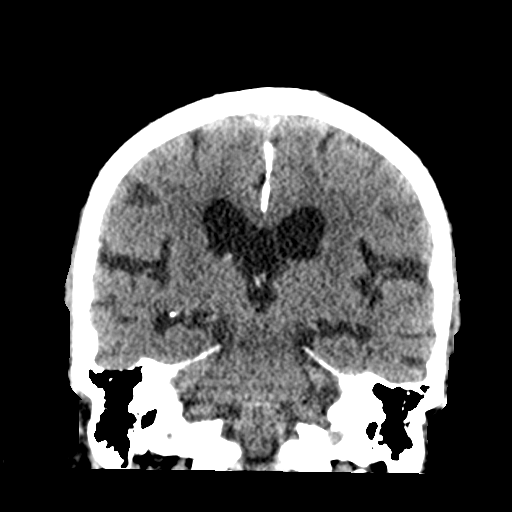

[Series 5: sagittal soft · sagittal · 0.34mm/px · 3 of 57 slices shown]
[im 19/57  brain]
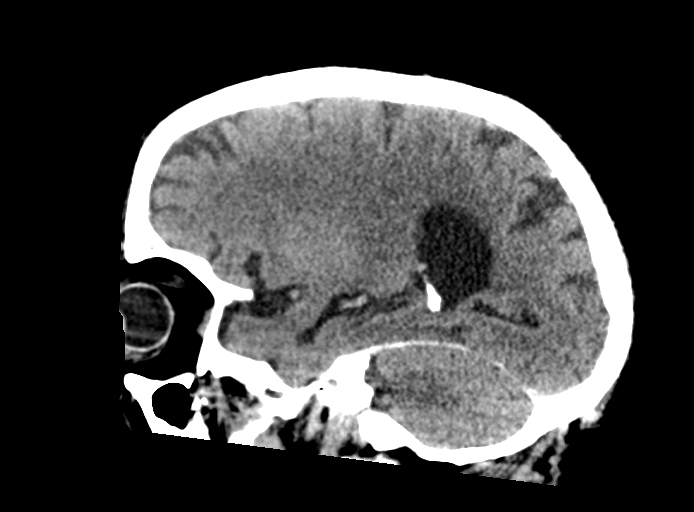
[im 29/57  brain]
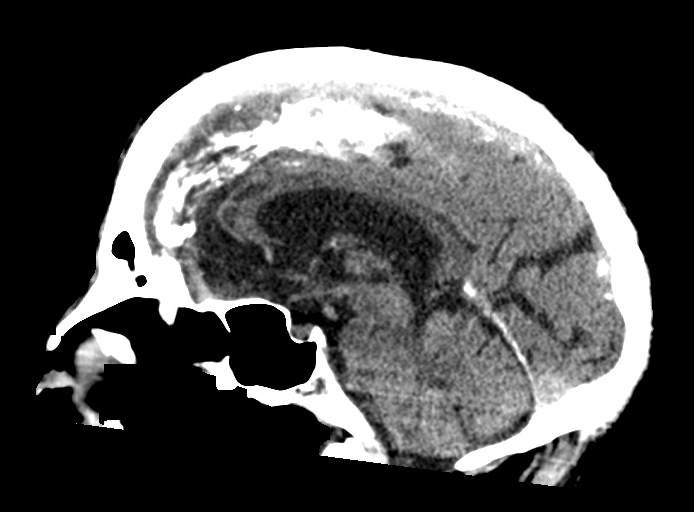
[im 38/57  brain]
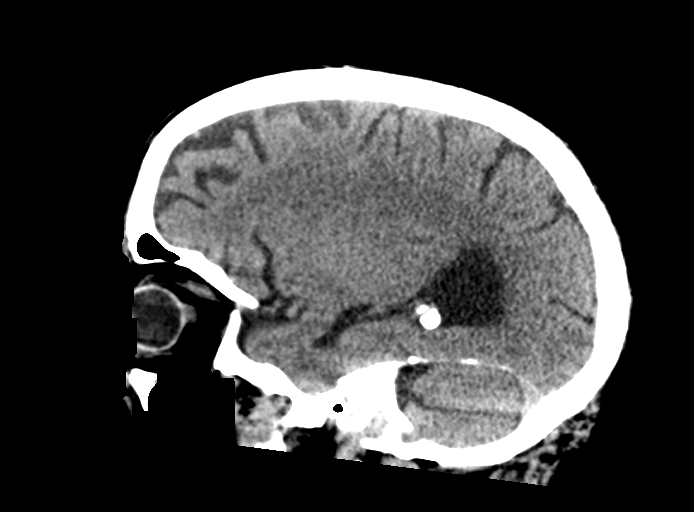

[17 of 47 positions shown; findings below may reference images not displayed]

FINDINGS: Brain: Age related atrophy. Chronic small-vessel ischemic changes of
the cerebral hemispheric white matter. No sign of acute infarction,
mass lesion, hemorrhage, hydrocephalus or extra-axial collection.
Patient has a tendency towards dural calcification.

Vascular: There is atherosclerotic calcification of the major
vessels at the base of the brain.

Skull: Negative

Sinuses/Orbits: Clear/normal

Other: None
IMPRESSION: No acute finding by CT. Age related atrophy and chronic small-vessel
ischemic changes of the white matter.

## 2023-12-14 ENCOUNTER — Emergency Department (HOSPITAL_COMMUNITY)
Admission: EM | Admit: 2023-12-14 | Discharge: 2024-01-11 | Disposition: E | Attending: Emergency Medicine | Admitting: Emergency Medicine

## 2023-12-14 DIAGNOSIS — I480 Paroxysmal atrial fibrillation: Secondary | ICD-10-CM | POA: Diagnosis not present

## 2023-12-14 DIAGNOSIS — I509 Heart failure, unspecified: Secondary | ICD-10-CM | POA: Diagnosis not present

## 2023-12-14 DIAGNOSIS — I110 Hypertensive heart disease with heart failure: Secondary | ICD-10-CM | POA: Diagnosis not present

## 2023-12-14 DIAGNOSIS — E119 Type 2 diabetes mellitus without complications: Secondary | ICD-10-CM | POA: Diagnosis not present

## 2023-12-14 DIAGNOSIS — I2510 Atherosclerotic heart disease of native coronary artery without angina pectoris: Secondary | ICD-10-CM | POA: Insufficient documentation

## 2023-12-14 DIAGNOSIS — Z87891 Personal history of nicotine dependence: Secondary | ICD-10-CM | POA: Insufficient documentation

## 2023-12-14 DIAGNOSIS — R0600 Dyspnea, unspecified: Secondary | ICD-10-CM | POA: Diagnosis present

## 2023-12-14 MED ORDER — EPINEPHRINE 1 MG/10ML IJ SOSY
PREFILLED_SYRINGE | INTRAMUSCULAR | Status: AC | PRN
  Administered 2023-12-14 (×4): 1 mg via INTRAVENOUS

## 2023-12-17 LAB — CBG MONITORING, ED: Glucose-Capillary: 119 mg/dL — ABNORMAL HIGH (ref 70–99)

## 2024-01-11 NOTE — ED Provider Notes (Addendum)
 Emergency Department Provider Note   I have reviewed the triage vital signs and the nursing notes.   HISTORY  Chief Complaint No chief complaint on file.   HPI Christian Lloyd is a 89 y.o. male with a history of seizures, diabetes, TIA, coronary artery disease, hyperlipidemia, atrial fibrillation, presumably CHF who presents the ER as a CPR in progress.  History obtained from paramedics and eventually the family.  Sounds like the patient had started with dyspnea this evening even on his home oxygen.  They increased it to 15 L and could not get his oxygen saturation above 70% so they called EMS.  On EMS arrival patient was agonal he breathing.  Started bag valve mask ventilation and brought him here.  King airway placed and route.  IV placed with epinephrine .  LEVEL V CAVEAT APPLIES SECONDARY TO CPR   Past Medical History:  Diagnosis Date   Dysrhythmia    Myocardial infarction (HCC) 04/2017    Patient Active Problem List   Diagnosis Date Noted   Seizure --- complex partial seizures 12/23/2019   Acute metabolic encephalopathy 12/22/2019   TIA (transient ischemic attack) 12/17/2019   DM (diabetes mellitus), type 2 (HCC) 12/17/2019   CAD (coronary artery disease) 12/17/2019   Essential hypertension 12/17/2019   Hyperlipemia 12/17/2019   AF (paroxysmal atrial fibrillation) (HCC) 12/17/2019    Past Surgical History:  Procedure Laterality Date   CIRCUMCISION N/A 01/13/2019   Procedure: CIRCUMCISION ADULT;  Surgeon: Matilda Senior, MD;  Location: [facility];  Service: Urology;  Laterality: N/A;  30 MINS   CORONARY ANGIOPLASTY  04/2017   [facility]    Current Outpatient Rx   Order #: [id number] Class: Historical Med   Order #: [id number] Class: Normal   Order #: [id number] Class: Print   Order #: [id number] Class: Historical Med   Order #: [id number] Class: Historical Med   Order #: [id number] Class: Historical Med   Order #: [id number] Class: Normal   Order #: [id number] Class:  Historical Med   Order #: [id number] Class: Historical Med    Allergies Patient has no known allergies.  Family History  Problem Relation Age of Onset   Diabetes Other    CAD Other    Hypertension Other     Social History Social History   Tobacco Use   Smoking status: Former    Current packs/day: 0.00    Types: Cigarettes    Quit date: 1998    Years since quitting: 27.6   Smokeless tobacco: Never  Vaping Use   Vaping status: Never Used  Substance Use Topics   Alcohol use: Never   Drug use: Never    Review of Systems  LEVEL V CAVEAT APPLIES SECONDARY TO CPR ____________________________________________  PHYSICAL EXAM:  VITAL SIGNS: ED Triage Vitals  Encounter Vitals Group     BP      Girls Systolic BP Percentile      Girls Diastolic BP Percentile      Boys Systolic BP Percentile      Boys Diastolic BP Percentile      Pulse      Resp      Temp      Temp src      SpO2      Weight      Height      Head Circumference      Peak Flow      Pain Score      Pain Loc      Pain Education      Exclude from  Growth Chart     Constitutional: Ill appearing. Well nourished. Eyes: Conjunctivae are normal. 5mm fixed Pupils.  Head: Atraumatic. Nose: No congestion/rhinnorhea. Mouth/Throat: Mucous membranes are dry, King in place.  Oropharynx non-erythematous. Neck: No stridor.  No meningeal signs.   Cardiovascular: No rate, No rhythm.  Respiratory: No respiratory effort.  Lungs clear Gastrointestinal: Soft. No distention.  Musculoskeletal: No lower extremity edema. No gross deformities of extremities. Neurologic:  Not able to assess 2/2 condition.  Skin:   No rash noted. Psych: Not able to assess 2/2 condition.   ____________________________________________   LABS (all labs ordered are listed, but only abnormal results are displayed)  Labs Reviewed - No data to display ____________________________________________  EKG   EKG Interpretation Date/Time:     Ventricular Rate:    PR Interval:    QRS Duration:    QT Interval:    QTC Calculation:   R Axis:      Text Interpretation:          ____________________________________________  RADIOLOGY  No results found. ____________________________________________   PROCEDURES  Procedure(s) performed:   .Critical Care  Performed by: Lorette Mayo, MD Authorized by: Lorette Mayo, MD   Critical care provider statement:    Critical care time (minutes):  30   Critical care was necessary to treat or prevent imminent or life-threatening deterioration of the following conditions:  Cardiac failure   Critical care was time spent personally by me on the following activities:  Development of treatment plan with patient or surrogate, discussions with consultants, evaluation of patient's response to treatment, examination of patient, ordering and review of laboratory studies, ordering and review of radiographic studies, ordering and performing treatments and interventions, pulse oximetry, re-evaluation of patient's condition and review of old charts CPR  Date/Time: 01/06/2024 3:38 AM  Performed by: Lorette Mayo, MD Authorized by: Lorette Mayo, MD  CPR Procedure Details:    ACLS/BLS initiated by EMS: Yes     CPR/ACLS performed in the ED: Yes     Duration of CPR (minutes):  20   Outcome: Pt declared dead    CPR performed via ACLS guidelines under my direct supervision.  See RN documentation for details including defibrillator use, medications, doses and timing. Comments:     94  ____________________________________________   INITIAL IMPRESSION / ASSESSMENT AND PLAN / ED COURSE  Pertinent labs & imaging results that were available during my care of the patient were reviewed by me and considered in my medical decision making (see chart for details).   89 year old male brought in as a CPR in progress.  Suspect likely COPD exacerbation as he has that chronically but also could be heart  failure exacerbation versus PE versus pneumonia although the abrupt onset does not support pneumonia.  CPR was continued with multiple doses of epinephrine  here without change from asystole.  Family (granddaughter) arrived and said the patient would not want to be on life support and would not want any artificial means of extending his life.  She was allowed to come back to the room.  She verified with the patient's daughters on the phone in my presence that the patient would not want CPR or any type of artificial means for vital functions.  CPR was stopped and patient was declared dead at 59.  Family at bedside.  Secondary to age and multiple medical issues I would not think this is appropriate for a medical examiner case.  ____________________________________________  FINAL CLINICAL IMPRESSION(S) / ED DIAGNOSES  Final diagnoses:  Heart failure, unspecified HF chronicity, unspecified heart failure type (HCC)    MEDICATIONS GIVEN DURING THIS VISIT:  Medications  EPINEPHrine  (ADRENALIN ) 1 MG/10ML injection (1 mg Intravenous Given 12/22/2023 0326)    NEW OUTPATIENT MEDICATIONS STARTED DURING THIS VISIT:  New Prescriptions   No medications on file    Note:  This document was prepared using Dragon voice recognition software and may include unintentional dictation errors.     Juanetta Negash, Selinda, MD 12/22/2023 9657    Lorette Selinda, MD 12-22-2023 [phone]

## 2024-01-11 NOTE — ED Notes (Signed)
Security called for transport to the morgue.

## 2024-01-11 NOTE — ED Notes (Signed)
 Pts CBG was 119mg /dl.

## 2024-01-11 NOTE — Code Documentation (Signed)
Family at beside. Family given emotional support. 

## 2024-01-11 NOTE — ED Notes (Signed)
 Compression stopped per family request, time of death [phone]

## 2024-01-11 NOTE — ED Notes (Signed)
 [facility] has been called to give the pt's information. The funeral home is expecting a called from placement to let them know when the body is officially released.

## 2024-01-11 NOTE — ED Triage Notes (Addendum)
 Pt BIB from home [facility] CPR in progress, called out for decreased O2 at 70s, per EMS pt PEA then asystole, pt given 1 epi PTA, king airway in place upon arrival, hx COPD, CHF, EDP and RT at bedside upon arrival

## 2024-01-11 NOTE — ED Notes (Signed)
 Per EDP at family request do not admin additional epi

## 2024-01-11 DEATH — deceased
